# Patient Record
Sex: Female | Born: 1970 | Race: White | Hispanic: No | Smoking: Never smoker
Health system: Southern US, Community
[De-identification: ages and names within clinical notes are randomized; demographics above are authoritative.]

## PROBLEM LIST (undated history)

## (undated) DIAGNOSIS — J302 Other seasonal allergic rhinitis: Secondary | ICD-10-CM

## (undated) DIAGNOSIS — D751 Secondary polycythemia: Secondary | ICD-10-CM

## (undated) DIAGNOSIS — K5792 Diverticulitis of intestine, part unspecified, without perforation or abscess without bleeding: Secondary | ICD-10-CM

## (undated) DIAGNOSIS — F329 Major depressive disorder, single episode, unspecified: Secondary | ICD-10-CM

## (undated) DIAGNOSIS — N882 Stricture and stenosis of cervix uteri: Secondary | ICD-10-CM

## (undated) DIAGNOSIS — R87619 Unspecified abnormal cytological findings in specimens from cervix uteri: Secondary | ICD-10-CM

## (undated) DIAGNOSIS — F419 Anxiety disorder, unspecified: Secondary | ICD-10-CM

## (undated) DIAGNOSIS — D649 Anemia, unspecified: Secondary | ICD-10-CM

## (undated) DIAGNOSIS — N979 Female infertility, unspecified: Secondary | ICD-10-CM

## (undated) DIAGNOSIS — N809 Endometriosis, unspecified: Secondary | ICD-10-CM

## (undated) DIAGNOSIS — F32A Depression, unspecified: Secondary | ICD-10-CM

## (undated) DIAGNOSIS — N912 Amenorrhea, unspecified: Secondary | ICD-10-CM

## (undated) HISTORY — DX: Female infertility, unspecified: N97.9

## (undated) HISTORY — DX: Stricture and stenosis of cervix uteri: N88.2

## (undated) HISTORY — DX: Endometriosis, unspecified: N80.9

## (undated) HISTORY — PX: COLONOSCOPY: SHX174

## (undated) HISTORY — DX: Unspecified abnormal cytological findings in specimens from cervix uteri: R87.619

## (undated) HISTORY — DX: Secondary polycythemia: D75.1

## (undated) HISTORY — DX: Diverticulitis of intestine, part unspecified, without perforation or abscess without bleeding: K57.92

## (undated) HISTORY — DX: Amenorrhea, unspecified: N91.2

## (undated) HISTORY — DX: Anxiety disorder, unspecified: F41.9

---

## 2001-01-11 ENCOUNTER — Other Ambulatory Visit: Admission: RE | Admit: 2001-01-11 | Discharge: 2001-01-11 | Payer: Self-pay | Admitting: Gastroenterology

## 2001-01-11 ENCOUNTER — Encounter (INDEPENDENT_AMBULATORY_CARE_PROVIDER_SITE_OTHER): Payer: Self-pay | Admitting: Specialist

## 2001-09-20 HISTORY — PX: ENDOMETRIAL ABLATION: SHX621

## 2001-09-20 HISTORY — PX: HYSTEROSCOPY: SHX211

## 2001-09-25 ENCOUNTER — Other Ambulatory Visit: Admission: RE | Admit: 2001-09-25 | Discharge: 2001-09-25 | Payer: Self-pay | Admitting: Gynecology

## 2002-09-20 HISTORY — PX: PELVIC LAPAROSCOPY: SHX162

## 2002-11-19 ENCOUNTER — Other Ambulatory Visit: Admission: RE | Admit: 2002-11-19 | Discharge: 2002-11-19 | Payer: Self-pay | Admitting: Gynecology

## 2003-06-21 HISTORY — PX: OOPHORECTOMY: SHX86

## 2003-07-11 ENCOUNTER — Encounter (INDEPENDENT_AMBULATORY_CARE_PROVIDER_SITE_OTHER): Payer: Self-pay | Admitting: *Deleted

## 2003-07-11 ENCOUNTER — Inpatient Hospital Stay (HOSPITAL_COMMUNITY): Admission: RE | Admit: 2003-07-11 | Discharge: 2003-07-12 | Payer: Self-pay | Admitting: Gynecology

## 2003-12-17 ENCOUNTER — Other Ambulatory Visit: Admission: RE | Admit: 2003-12-17 | Discharge: 2003-12-17 | Payer: Self-pay | Admitting: Gynecology

## 2005-01-13 ENCOUNTER — Other Ambulatory Visit: Admission: RE | Admit: 2005-01-13 | Discharge: 2005-01-13 | Payer: Self-pay | Admitting: Gynecology

## 2006-03-10 ENCOUNTER — Other Ambulatory Visit: Admission: RE | Admit: 2006-03-10 | Discharge: 2006-03-10 | Payer: Self-pay | Admitting: Gynecology

## 2007-06-26 ENCOUNTER — Other Ambulatory Visit: Admission: RE | Admit: 2007-06-26 | Discharge: 2007-06-26 | Payer: Self-pay | Admitting: Gynecology

## 2010-05-01 ENCOUNTER — Encounter: Admission: RE | Admit: 2010-05-01 | Discharge: 2010-05-01 | Payer: Self-pay | Admitting: Gynecology

## 2011-02-05 NOTE — Op Note (Signed)
NAME:  Theresa Singleton, Theresa Singleton                       ACCOUNT NO.:  [id number]   MEDICAL RECORD NO.:  [id number]                   PATIENT TYPE:  INP   LOCATION:  X001                                 FACILITY:  [facility]   PHYSICIAN:  Howard C. Mezer, M.D.               DATE OF BIRTH:  08-26-1971   DATE OF PROCEDURE:  07/11/2003  DATE OF DISCHARGE:                                 OPERATIVE REPORT   PREOPERATIVE DIAGNOSES:  1. Pelvic pain.  2. Adhesions.  3. Endometriosis.   POSTOPERATIVE DIAGNOSES:  1. Pelvic pain.  2. Adhesions.  3. Endometriosis.   PROCEDURE:  Exploratory laparotomy with lysis of adhesions and right  salpingo-oophorectomy.   SURGEON:  Leatha Gilding. Mezer, M.D.   ASSISTANT:  Leona Singleton, M.D.   ANESTHESIA:  General endotracheal anesthesia.   PREPARATION:  Betadine.   DESCRIPTION OF PROCEDURE:  With the patient in the supine position she was  prepped and draped in the routine fashion.  A Pfannenstiel incision was made  through the skin and subcutaneous tissue.  The fascia and peritoneum were  opened without difficulty.  Exploration of the upper abdomen was benign.  Exploration of the pelvis revealed the uterus to be normal in size.  The  left ovary was lightly adherent to the cul-de-sac and posterior broad  ligament.  The left fallopian tube was essentially normal.  The right  fallopian tube was essentially normal and the right ovary was densely  adherent to the posterior aspect of the broad ligament and lateral to mid  portion of the uterus.  Attempts to finger dissect the ovary free were not  successful given the denseness of the adhesions.  The ureter was identified  on the medial side of the peritoneum.  The lateral peritoneum was opened and  the excision extended inferiorly and superiorly and parallel to the  infundibulopelvic ligament.  The pelvic sidewall was opened.  The ureter  identified a second time and its course traced. The infundibulopelvic  ligament  was skeletonized, clamped, cut and free tied with #1 chromic and  then suture ligated with #1 chromic.  The base of the ovary was then very  carefully and slowly dissected free, taking care to avoid the ureter and  taking care to not have the dissection be deep enough to enter the uterine  artery and vein, at the same time to remove all of the ovarian tissue.  In  the process of doing this, an ovarian cysts was ruptured that was likely an  endometrioma.  The uterine artery and vein were clamped, cut, and suture  ligated with #1 chromic and then the tube and ovary were crossclamped in the  proximal isthmic portion of the fallopian tube and the stump was suture  ligated with #1 chromic.  The pelvic sidewall was completely dry but at this  point, it was noted that the bowel was significantly advanced in the cul-de-  sac and the posterior aspect of the cervix and uterus.  This area had been  somewhat opened during the dissection process and there was a moderate  amount of oozing and bleeding.  A figure-of-eight suture of 2-0 Vicryl was  placed on the lateral aspect of the uterus, taking care to note the course  of the ureter.  There was some bleeding in the area of the bowel which was  arrested with gentle cautery.  The area was very carefully observed and  hemostasis was noted to be intact.  Given the vascularity of the adhesions  and the fact that the patient's pain was significantly on the right side,  the decision was not to approach the left side, given the significant  potential, that possibly a second salpingo-oophorectomy would need to be  accomplished.  At the completion of the procedure with hemostasis intact, an  effort was made to place the large-bowel in the cul-de-sac.  The omentum was  brought down and the abdomen was closed in layers with running 2-0 Vicryl on  the peritoneum, running 0 Vicryl at the midline bilaterally on the fascia.  Hemostasis was assured in the subcutaneous  tissue and the skin was closed  with staples.  The estimated blood loss was approximately 100 mL.  The  sponge, needle and instrument counts were correct x2.  The patient tolerated  the procedure well and was sent to the recovery room in satisfactory  condition.                                               Leatha Gilding. Mezer, M.D.    HCM/MEDQ  D:  07/11/2003  T:  07/11/2003  Job:  [id number]   cc:   Leona Singleton, M.D.  [address]  [place]  [place] [address]  Fax: [phone]

## 2011-02-05 NOTE — H&P (Signed)
NAME:  AJAI, TERHAAR                       ACCOUNT NO.:  [id number]   MEDICAL RECORD NO.:  [id number]                   PATIENT TYPE:  INP   LOCATION:  X001                                 FACILITY[facility]   PHYSICIAN:  Howard C. Mezer, M.D.               DATE OF BIRTH:  [birth year]   DATE OF ADMISSION:  07/11/2003  DATE OF DISCHARGE:                                HISTORY & PHYSICAL   ADMITTING DIAGNOSIS:  Pelvic pain, adhesions, and endometriosis.   HISTORY OF PRESENT ILLNESS:  The patient is a 40 year old gravida 1 para 1  female admitted with a long history of right lower quadrant pain, adhesions,  and endometriosis.  The patient underwent two previous laparoscopies before  moving to [facility] in 1997 and 1999 at which time endometriosis and right  pelvic adhesions involving the ovary were noted and treated.  The patient  underwent a diagnostic laparoscopy under my care in May 2004 at which time  the right ovary was very densely adherent to the posterior broad ligament  and the attachment of these adhesions was very dense and wide and likely  over the uterine artery and vein.  This did not appear to be approachable  laparoscopically and the decision was made to offer the patient laparotomy.  The patient feels that her pain is significant enough to justify a  laparotomy and right salpingo-oophorectomy and she understands that there is  absolutely no guarantee to relieve her pelvic pain with this surgery.  Exploratory laparotomy and right salpingo-oophorectomy have been discussed  in detail and potential complications including but not limited to injury to  the bowel, bladder, ureter, possible transfusion with its sequelae, possible  infection, and possible need for a hysterectomy.  Increased risks secondary  to obesity have been discussed.  Postoperative restrictions and expectations  have been discussed and pain control has been reviewed.   PAST MEDICAL HISTORY:  1.  Surgical:  Diagnostic laparoscopy x3, hysteroscopy D&C.  2. Medical:  Obesity.   MEDICATIONS:  None.  No herbs or supplements.   ALLERGIES:  None known.   HABITS:  Smokes:  None.  ETOH:  Occasional.   SOCIAL HISTORY:  The patient is married.   FAMILY HISTORY:  Positive for diabetes, hypertension, CVA.   PHYSICAL EXAMINATION:  HEENT:  Negative.  LUNGS:  Clear.  HEART:  Without murmurs.  BREASTS:  Without masses or discharge.  ABDOMEN:  Obese, soft, and nontender.  PELVIC:  Reveals the BUS, vagina, and cervix to be normal.  The uterus is  anteverted, normal in size.  The adnexa are without palpable masses.  EXTREMITIES:  Negative.    IMPRESSION:  Pelvic pain, pelvic adhesions, endometriosis, obesity.   PLAN:  Exploratory laparotomy with lysis of adhesions and right salpingo-  oophorectomy.  Leatha Gilding. Mezer, M.D.    HCM/MEDQ  D:  07/11/2003  T:  07/11/2003  Job:  [id number]

## 2011-05-27 ENCOUNTER — Other Ambulatory Visit: Payer: Self-pay | Admitting: Gynecology

## 2013-10-24 ENCOUNTER — Other Ambulatory Visit: Payer: Self-pay | Admitting: Gynecology

## 2014-07-15 ENCOUNTER — Other Ambulatory Visit: Payer: Self-pay | Admitting: Gynecology

## 2014-07-16 LAB — CYTOLOGY - PAP

## 2014-09-20 DIAGNOSIS — R87619 Unspecified abnormal cytological findings in specimens from cervix uteri: Secondary | ICD-10-CM

## 2014-09-20 HISTORY — DX: Unspecified abnormal cytological findings in specimens from cervix uteri: R87.619

## 2015-01-20 ENCOUNTER — Other Ambulatory Visit: Payer: Self-pay | Admitting: Gynecology

## 2015-01-21 LAB — CYTOLOGY - PAP

## 2015-02-24 ENCOUNTER — Other Ambulatory Visit: Payer: Self-pay | Admitting: Gynecology

## 2015-02-25 LAB — CYTOLOGY - PAP

## 2015-07-21 ENCOUNTER — Ambulatory Visit (INDEPENDENT_AMBULATORY_CARE_PROVIDER_SITE_OTHER): Payer: 59 | Admitting: Obstetrics and Gynecology

## 2015-07-21 ENCOUNTER — Encounter: Payer: Self-pay | Admitting: Obstetrics and Gynecology

## 2015-07-21 VITALS — BP 112/80 | HR 68 | Resp 18 | Ht 64.5 in | Wt 216.0 lb

## 2015-07-21 DIAGNOSIS — N915 Oligomenorrhea, unspecified: Secondary | ICD-10-CM

## 2015-07-21 DIAGNOSIS — Z01419 Encounter for gynecological examination (general) (routine) without abnormal findings: Secondary | ICD-10-CM | POA: Diagnosis not present

## 2015-07-21 DIAGNOSIS — Z0000 Encounter for general adult medical examination without abnormal findings: Secondary | ICD-10-CM

## 2015-07-21 DIAGNOSIS — N926 Irregular menstruation, unspecified: Secondary | ICD-10-CM

## 2015-07-21 LAB — POCT URINALYSIS DIPSTICK
BILIRUBIN UA: NEGATIVE
Blood, UA: 5
GLUCOSE UA: NEGATIVE
KETONES UA: NEGATIVE
Leukocytes, UA: NEGATIVE
Nitrite, UA: NEGATIVE
Protein, UA: NEGATIVE
Urobilinogen, UA: NEGATIVE

## 2015-07-21 LAB — COMPREHENSIVE METABOLIC PANEL
ALBUMIN: 4 g/dL (ref 3.6–5.1)
ALT: 24 U/L (ref 6–29)
AST: 30 U/L (ref 10–35)
Alkaline Phosphatase: 70 U/L (ref 33–115)
BUN: 13 mg/dL (ref 7–25)
CALCIUM: 8.9 mg/dL (ref 8.6–10.2)
CHLORIDE: 108 mmol/L (ref 98–110)
CO2: 27 mmol/L (ref 20–31)
Creat: 0.81 mg/dL (ref 0.50–1.10)
Glucose, Bld: 88 mg/dL (ref 65–99)
POTASSIUM: 4.8 mmol/L (ref 3.5–5.3)
Sodium: 141 mmol/L (ref 135–146)
TOTAL PROTEIN: 7.1 g/dL (ref 6.1–8.1)
Total Bilirubin: 0.6 mg/dL (ref 0.2–1.2)

## 2015-07-21 LAB — CBC
HEMATOCRIT: 45.7 % (ref 36.0–46.0)
HEMOGLOBIN: 15.5 g/dL — AB (ref 12.0–15.0)
MCH: 32.7 pg (ref 26.0–34.0)
MCHC: 33.9 g/dL (ref 30.0–36.0)
MCV: 96.4 fL (ref 78.0–100.0)
MPV: 8.4 fL — AB (ref 8.6–12.4)
Platelets: 282 10*3/uL (ref 150–400)
RBC: 4.74 MIL/uL (ref 3.87–5.11)
RDW: 13.2 % (ref 11.5–15.5)
WBC: 6.8 10*3/uL (ref 4.0–10.5)

## 2015-07-21 LAB — HEMOGLOBIN, FINGERSTICK: Hemoglobin, fingerstick: 15.6 g/dL (ref 12.0–16.0)

## 2015-07-21 LAB — LIPID PANEL
CHOL/HDL RATIO: 3.4 ratio (ref <=5.0)
Cholesterol: 165 mg/dL (ref 125–200)
HDL: 49 mg/dL (ref >=46)
LDL Cholesterol: 103 mg/dL (ref <130)
TRIGLYCERIDES: 67 mg/dL (ref <150)
VLDL: 13 mg/dL (ref <30)

## 2015-07-21 LAB — POCT URINE PREGNANCY: Preg Test, Ur: NEGATIVE

## 2015-07-21 LAB — THYROID PANEL WITH TSH
FREE THYROXINE INDEX: 2.2 (ref 1.4–3.8)
T3 UPTAKE: 28 % (ref 22–35)
T4, Total: 7.8 ug/dL (ref 4.5–12.0)
TSH: 2.949 u[IU]/mL (ref 0.350–4.500)

## 2015-07-21 NOTE — Progress Notes (Addendum)
44 y.o. G66P1001 Divorced Caucasian female here for annual exam.    Hx of endometriosis dx in 1997 through laparoscopy.  Had an ovarian cyst removal? Treated with Lupron.  Repeat laparoscopy in 1998 or 1999.   Status post laparotomy with right salpingo-oophorectomy in 2004 for endometrioma/endometriosis.   Menses now are skipping.  Can skip 6 - 8 weeks.  LMP was in September 16 - 22, 2016.  In October, bleeding has been intermittent spotting with clotting.   Spotted 10/1 off and on and then had clotting in mid-October. Spotting a little last week.  Increasing pain this month off and on. Usually has a lot of pain during her menses.  Has used Ponstel for pain in the past.  Naproxen Rx has not worked as well.   Took Camilla in the past and just stopped due to forgetting it.  Used NuvaRing in the past also.  Partner has had vasectomy.   Denies painful bowel movements.  Denies blood in the stool.  Denies dysuria or hematuria.   Has an Rx from Dr. Carren Rang for Provera but has not taken this.   Last pelvic ultrasound was a couple of year ago.   Hx of monitoring CA125s by Dr. Carren Rang to do monitor for ovarian cancer.  Has been normal in the past.  No family history of ovarian cancer.   UPT - neg. Hgb 15.6.  Has a 54 yo son and is planning to go to law school.  Works at the [employer] in [place].  Specialist in trauma and anxiety.   PCP:   Rolanda Lundborg  Patient's last menstrual period was 06/06/2015.          Sexually active: Yes.    The current method of family planning is vasectomy.    Exercising: Yes.    Walking Smoker:  no  Health Maintenance: Pap:  02/24/15 neg History of abnormal Pap:  Yes, CIN1 2016.   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 Pathology showed koilocytic atypia and endometriosis and ECC showed acute and chronic inflammation. Pap 10/17/13 - LGSIL and negative HR HPV, Pap 03/19/14 - Neg and meg HR HPV, Pap 07/15/14 - negative and no  high risk HPV testing, Pap 01/20/15 - negative but no endocervical cells and no high risk HPV testing, Pap 02/24/15 - negative pap and no HR HPV testing done.  Specimen was adequate.  MMG:  06/2014 Normal - per pt.  Colonoscopy:  2014 Diverticulitis  BMD:   Never  TDaP:  2014 Screening Labs:  Hb today: 15.6, Urine today: Negative    reports that she has never smoked. She has never used smokeless tobacco. She reports that she drinks about 1.2 oz of alcohol per week. She reports that she does not use illicit drugs.  Past Medical History  Diagnosis Date  . Amenorrhea   . Endometriosis   . Anxiety   . Infertility, female   . Diverticulitis   . Abnormal Pap smear of cervix 2016    CIN I    Past Surgical History  Procedure Laterality Date  . Oophorectomy Right 06/2003     Endometriosis   . Pelvic laparoscopy  2004    several. endometriosis   . Hysteroscopy  2003  . Endometrial ablation  2003    Current Outpatient Prescriptions  Medication Sig Dispense Refill  . FIBER COMPLETE PO Take by mouth daily.    [name] glucosamine-chondroitin 500-400 MG tablet Take 1 tablet by mouth daily.    [name]  Multiple Vitamin (MULTIVITAMIN) tablet Take 1 tablet by mouth daily.    . naproxen sodium (ANAPROX) 550 MG tablet Take 550 mg by mouth 3 (three) times daily with meals.     . medroxyPROGESTERone (PROVERA) 10 MG tablet Take 10 mg by mouth daily.      No current facility-administered medications for this visit.    Family History  Problem Relation Age of Onset  . Diabetes Maternal Uncle   . Diabetes Maternal Uncle     ROS:  Pertinent items are noted in HPI.  Otherwise, a comprehensive ROS was negative.  Exam:   BP 112/80 mmHg  Pulse 68  Resp 18  Ht 5' 4.5" (1.638 m)  Wt 216 lb (97.977 kg)  BMI 36.52 kg/m2  LMP 06/06/2015    General appearance: alert, cooperative and appears stated age Head: Normocephalic, without obvious abnormality, atraumatic Neck: no adenopathy, supple, symmetrical, trachea  midline and thyroid normal to inspection and palpation Lungs: clear to auscultation bilaterally Breasts: normal appearance, no masses or tenderness, Inspection negative, No nipple retraction or dimpling, No nipple discharge or bleeding, No axillary or supraclavicular adenopathy Heart: regular rate and rhythm Abdomen: soft, non-tender; bowel sounds normal; no masses,  no organomegaly Extremities: extremities normal, atraumatic, no cyanosis or edema on the right.  Left lower extremity with extensive bruising and edema (States she fell and had evaluation with her PCP who cleared her leg.) Skin: Skin color, texture, turgor normal. No rashes or lesions Lymph nodes: Cervical, supraclavicular, and axillary nodes normal. No abnormal inguinal nodes palpated Neurologic: Grossly normal  Pelvic: External genitalia:  no lesions              Urethra:  normal appearing urethra with no masses, tenderness or lesions              Bartholins and Skenes: normal                 Vagina: normal appearing vagina with normal color and discharge, no lesions              Cervix: cervix distorted and firm to touch. hard to really identify the cervical os.               Pap taken: Yes.   Bimanual Exam:  Uterus:  normal size, contour, position, consistency, mobility, non-tender.  Exam limited by body habitus.               Adnexa: normal adnexa              Rectovaginal: Yes.  .  Confirms.              Anus:  normal sphincter tone, no lesions  Chaperone was present for exam.  Assessment:   Well woman visit with normal exam. Irregular menses.  Dysmenorrhea. Status post laparoscopies and right salpingo-oophorectomy - endometriosis.  Hx LGSIL.  Hx of biopsy confirmed endometriosis of the cervix. Cervical distortion on exam.   Plan: Yearly mammogram recommended after age 75.  Recommended self breast exam.  Pap and HR HPV as above. Recommendations for Calcium, Vitamin D, regular exercise program including  cardiovascular and weight bearing exercise. Labs performed.  Yes.  .   See orders.  Lipids, CMP, TFTs, CBC.  Refills given on medications.  No..    Return for sonohysterogram and potential endometrial biopsy.       After visit summary provided.

## 2015-07-21 NOTE — Patient Instructions (Signed)

## 2015-07-23 ENCOUNTER — Telehealth: Payer: Self-pay

## 2015-07-23 LAB — IPS PAP TEST WITH HPV

## 2015-07-23 NOTE — Telephone Encounter (Signed)
-----   Message from Nunzio Cobbs, MD sent at 07/22/2015  7:53 PM EDT ----- Please report normal blood work to patient.  Lipids, complete blood chemistries, blood counts, and thyroid function are all unremarkable.  Cc- Marisa Sprinkles

## 2015-07-23 NOTE — Telephone Encounter (Signed)
Left message to call Kaitlyn at [phone]. 

## 2015-07-23 NOTE — Telephone Encounter (Signed)
Spoke with patient. Advised of results as seen below. Patient is agreeable and verbalizes understanding.  Patient would like to schedule [facility] at this time. Appointment scheduled for 08/07/2015 at 8:30 am with 9 am consult with Dr.Silva. Patient is agreeable to date and time. Patient verbalized understanding of the [facility] appointment cancellation policy. Advised will need to cancel or reschedule within 72 business hours of appointment (3 business days) or will have $100.00 late cancellation fee placed to account. $150.00 for Sonohysterogram.   Routing to provider for final review. Patient agreeable to disposition. Will close encounter.

## 2015-08-07 ENCOUNTER — Ambulatory Visit (INDEPENDENT_AMBULATORY_CARE_PROVIDER_SITE_OTHER): Payer: 59 | Admitting: Obstetrics and Gynecology

## 2015-08-07 ENCOUNTER — Ambulatory Visit (INDEPENDENT_AMBULATORY_CARE_PROVIDER_SITE_OTHER): Payer: 59

## 2015-08-07 ENCOUNTER — Encounter: Payer: Self-pay | Admitting: Obstetrics and Gynecology

## 2015-08-07 ENCOUNTER — Other Ambulatory Visit: Payer: Self-pay | Admitting: Obstetrics and Gynecology

## 2015-08-07 VITALS — BP 126/86 | HR 70 | Resp 20 | Ht 64.5 in | Wt 215.0 lb

## 2015-08-07 DIAGNOSIS — N882 Stricture and stenosis of cervix uteri: Secondary | ICD-10-CM | POA: Diagnosis not present

## 2015-08-07 DIAGNOSIS — N926 Irregular menstruation, unspecified: Secondary | ICD-10-CM

## 2015-08-07 DIAGNOSIS — N83202 Unspecified ovarian cyst, left side: Secondary | ICD-10-CM

## 2015-08-07 MED ORDER — IBUPROFEN 800 MG PO TABS: 800.0000 mg | ORAL_TABLET | Freq: Three times a day (TID) | ORAL | Status: DC | PRN

## 2015-08-07 MED ORDER — LEVONORGEST-ETH ESTRAD 91-DAY 0.15-0.03 MG PO TABS: 1.0000 | ORAL_TABLET | Freq: Every day | ORAL | Status: DC

## 2015-08-07 NOTE — Progress Notes (Signed)
Subjective  44 y.o. G49P1001  Caucasian female here for pelvic ultrasound for  Irregular spotting in October and pain with menses.  Last real menses was 06/06/15. Also has pain with menses.  Had episode of significant pain last week.  Wants new Rx for pain medication.  Naprosyn not helpful.  Ponstel too expensive.   Status post laparotomy with right salpingo-oophorectomy in 2004 for endometrioma/endometriosis.  Partner has had vasectomy.  Used NuvaRing and Camilla in the past.  No problems with combined contraception.   No HTN, migraines with aura, liver disease.  Is due for mammogram and will call for this to be done at [facility].   Objective  Pelvic ultrasound images and report reviewed with patient.  Uterus - no masses.  EMS - 12 mm. Ovaries - Right adnexa absent.  Left ovary witt 2 hemorrhagic cysts - 51 x 45 mm, 3.63 x 3.14 x 2.67 mm.  No clear endometrioma. Free fluid - yes, small amount around the left adnexa.      Attempted to do sonohysterogram and EMB.  Procedure abandoned. Speculum placed in vagina.  Sterile prep with betadine.  Tenaculum to anterior cervical lip.  Cannula does not pass. Os finder, small dilator used to try to get into os.  I think I am creating a false passage.   Assessment  Metrorrhagia recently.  Skipping cycles.  Dysmenorrhea. Hemorrhagic ovarian cysts. Hx of cervical endometriosis and abnormal pap.  Stenosis of cervix.  Plan  Discussion of options for care - high dose progesterone, combined contraception, surgical care. I do not feel strongly that the patient needs a dilation and curettage at this time.  Patient prefers medical therapy.  Would like a pill - Seasonale Rx given for 3 months.  Rx for Motrin 800 mg. Torsion precautions given.  Will do mammogram.  Return in 6 weeks for a repeat ultrasound and pill check.    ___15____ minutes face to face time of which over 50% was spent in counseling.   After visit summary to  patient.

## 2015-08-07 NOTE — Patient Instructions (Signed)
Ethinyl Estradiol; Levonorgestrel tablets What is this medicine? ETHINYL ESTRADIOL; LEVONORGESTREL (ETH in il es tra DYE ole; LEE voh nor jes trel) is an oral contraceptive. It combines two types of female hormones, an estrogen and a progestin. They are used to prevent ovulation and pregnancy. This medicine may be used for other purposes; ask your health care provider or pharmacist if you have questions. What should I tell my health care provider before I take this medicine? They need to know if you have or ever had any of these conditions: -abnormal vaginal bleeding -blood vessel disease or blood clots -breast, cervical, endometrial, ovarian, liver, or uterine cancer -diabetes -gallbladder disease -heart disease or recent heart attack -high blood pressure -high cholesterol -kidney disease -liver disease -migraine headaches -stroke -systemic lupus erythematosus (SLE) -tobacco smoker -an unusual or allergic reaction to estrogens, progestins, other medicines, foods, dyes, or preservatives -pregnant or trying to get pregnant -breast-feeding How should I use this medicine? Take this medicine by mouth. To reduce nausea, this medicine may be taken with food. Follow the directions on the prescription label. Take this medicine at the same time each day and in the order directed on the package. Do not take your medicine more often than directed. Contact your pediatrician regarding the use of this medicine in children. Special care may be needed. This medicine has been used in female children who have started having menstrual periods. A patient package insert for the product will be given with each prescription and refill. Read this sheet carefully each time. The sheet may change frequently. Overdosage: If you think you have taken too much of this medicine contact a poison control center or emergency room at once. NOTE: This medicine is only for you. Do not share this medicine with others. What if  I miss a dose? If you miss a dose, refer to the patient information sheet you received with your medicine for direction. If you miss more than one pill, this medicine may not be as effective and you may need to use another form of birth control. What may interact with this medicine? -acetaminophen -antibiotics or medicines for infections, especially rifampin, rifabutin, rifapentine, and griseofulvin, and possibly penicillins or tetracyclines -aprepitant -ascorbic acid (vitamin C) -atorvastatin -barbiturate medicines, such as phenobarbital -bosentan -carbamazepine -caffeine -clofibrate -cyclosporine -dantrolene -doxercalciferol -felbamate -grapefruit juice -hydrocortisone -medicines for anxiety or sleeping problems, such as diazepam or temazepam -medicines for diabetes, including pioglitazone -mineral oil -modafinil -mycophenolate -nefazodone -oxcarbazepine -phenytoin -prednisolone -ritonavir or other medicines for HIV infection or AIDS -rosuvastatin -selegiline -soy isoflavones supplements -St. John's wort -tamoxifen or raloxifene -theophylline -thyroid hormones -topiramate -warfarin This list may not describe all possible interactions. Give your health care provider a list of all the medicines, herbs, non-prescription drugs, or dietary supplements you use. Also tell them if you smoke, drink alcohol, or use illegal drugs. Some items may interact with your medicine. What should I watch for while using this medicine? Visit your doctor or health care professional for regular checks on your progress. You will need a regular breast and pelvic exam and Pap smear while on this medicine. Use an additional method of contraception during the first cycle that you take these tablets. If you have any reason to think you are pregnant, stop taking this medicine right away and contact your doctor or health care professional. If you are taking this medicine for hormone related problems, it  may take several cycles of use to see improvement in your condition. Smoking increases the risk of   getting a blood clot or having a stroke while you are taking birth control pills, especially if you are more than 45 years old. You are strongly advised not to smoke. This medicine can make your body retain fluid, making your fingers, hands, or ankles swell. Your blood pressure can go up. Contact your doctor or health care professional if you feel you are retaining fluid. This medicine can make you more sensitive to the sun. Keep out of the sun. If you cannot avoid being in the sun, wear protective clothing and use sunscreen. Do not use sun lamps or tanning beds/booths. If you wear contact lenses and notice visual changes, or if the lenses begin to feel uncomfortable, consult your eye care specialist. In some women, tenderness, swelling, or minor bleeding of the gums may occur. Notify your dentist if this happens. Brushing and flossing your teeth regularly may help limit this. See your dentist regularly and inform your dentist of the medicines you are taking. If you are going to have elective surgery, you may need to stop taking this medicine before the surgery. Consult your health care professional for advice. This medicine does not protect you against HIV infection (AIDS) or any other sexually transmitted diseases. What side effects may I notice from receiving this medicine? Side effects that you should report to your doctor or health care professional as soon as possible: -breast tissue changes or discharge -changes in vaginal bleeding during your period or between your periods -chest pain -coughing up blood -dizziness or fainting spells -headaches or migraines -leg, arm or groin pain -severe or sudden headaches -stomach pain (severe) -sudden shortness of breath -sudden loss of coordination, especially on one side of the body -speech problems -symptoms of vaginal infection like itching,  irritation or unusual discharge -tenderness in the upper abdomen -vomiting -weakness or numbness in the arms or legs, especially on one side of the body -yellowing of the eyes or skin Side effects that usually do not require medical attention (report to your doctor or health care professional if they continue or are bothersome): -breakthrough bleeding and spotting that continues beyond the 3 initial cycles of pills -breast tenderness -mood changes, anxiety, depression, frustration, anger, or emotional outbursts -increased sensitivity to sun or ultraviolet light -nausea -skin rash, acne, or brown spots on the skin -weight gain (slight) This list may not describe all possible side effects. Call your doctor for medical advice about side effects. You may report side effects to FDA at 1-800-FDA-1088. Where should I keep my medicine? Keep out of the reach of children. Store at room temperature between 15 and 30 degrees C (59 and 86 degrees F). Throw away any unused medicine after the expiration date. NOTE: This sheet is a summary. It may not cover all possible information. If you have questions about this medicine, talk to your doctor, pharmacist, or health care provider.    2016, Elsevier/Gold Standard. (2013-12-17 10:20:56)  

## 2015-09-23 ENCOUNTER — Telehealth: Payer: Self-pay | Admitting: Obstetrics and Gynecology

## 2015-09-23 NOTE — Telephone Encounter (Signed)
Call patient to discuss benefits for a procedure. Left Voicemail requesting a call.

## 2015-10-13 NOTE — Telephone Encounter (Signed)
Called patient to discuss benefits for a procedure. Left Voicemail requesting a call. °

## 2015-10-15 NOTE — Telephone Encounter (Signed)
Call patient to discuss benefits for a procedure. Left Voicemail requesting a call.

## 2015-10-20 DIAGNOSIS — D751 Secondary polycythemia: Secondary | ICD-10-CM | POA: Insufficient documentation

## 2015-10-20 NOTE — Telephone Encounter (Signed)
Patient called back was stated she was able to move her schedule around. Reviewed estimated benefit for ultrasound. Patient understood and agreeable. Patient ready to schedule. Patient scheduled 10/30/15 with Dr Quincy Simmonds. Pt aware of arrival date and time. Pt aware of 72 hours cancellation policy with [id number] fee. No further questions. Ok to close

## 2015-10-20 NOTE — Telephone Encounter (Signed)
Patient returned call regarding ultrasound. Patient had advised that she is only available for on Mondays and states had spoken with Dr Quincy Simmonds regarding this concern, and it was suggested that we can schedule at the hospital. Forwarding to clinical to review for scheduling options

## 2015-10-30 ENCOUNTER — Encounter: Payer: Self-pay | Admitting: Obstetrics and Gynecology

## 2015-10-30 ENCOUNTER — Ambulatory Visit (INDEPENDENT_AMBULATORY_CARE_PROVIDER_SITE_OTHER): Payer: 59 | Admitting: Obstetrics and Gynecology

## 2015-10-30 ENCOUNTER — Ambulatory Visit (INDEPENDENT_AMBULATORY_CARE_PROVIDER_SITE_OTHER): Payer: 59

## 2015-10-30 VITALS — BP 112/70 | HR 80 | Ht 64.5 in | Wt 217.0 lb

## 2015-10-30 DIAGNOSIS — N83202 Unspecified ovarian cyst, left side: Secondary | ICD-10-CM

## 2015-10-30 DIAGNOSIS — N946 Dysmenorrhea, unspecified: Secondary | ICD-10-CM

## 2015-10-30 DIAGNOSIS — N921 Excessive and frequent menstruation with irregular cycle: Secondary | ICD-10-CM | POA: Diagnosis not present

## 2015-10-30 DIAGNOSIS — N926 Irregular menstruation, unspecified: Secondary | ICD-10-CM

## 2015-10-30 MED ORDER — LEVONORGEST-ETH ESTRAD 91-DAY 0.15-0.03 MG PO TABS: 1.0000 | ORAL_TABLET | Freq: Every day | ORAL | Status: DC

## 2015-10-30 NOTE — Progress Notes (Signed)
Subjective  45 y.o. G70P1001 Divorced female here for pelvic ultrasound for follow up of left ovarian cysts from prior ultrasound 08/07/15 in this office: Ovaries - Right adnexa absent. Left ovary with 2 hemorrhagic cysts - 51 x 45 mm, 3.63 x 3.14 x 2.67 mm. No clear endometrioma.  Patient's last menstrual period was 10/20/2015 (approximate).  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  Status post 5 laparoscopies. Status post laparotomy with right salpingo-oophorectomy in 2004 for endometrioma/endometriosis. Had laparotomy due to scar tissue. Partner has had vasectomy.  Declines future childbearing.   Used NuvaRing and Camilla in the past.  Currently on Seasonale.  Having pain and heavy clotting.  No real improvement on Seasonale.  Spotted throughout December 2016 and January 2017. No problems with combined contraception.  No HTN, migraines with aura, liver disease.  Used Depo Lupron in the past and had terrible hot flashes and went on estrogen patch.   Last mammogram - 2015.   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   Objective  Pelvic ultrasound images and report reviewed with patient.  Uterus - no masses. EMS - 7.81 mm. Ovaries - left ovary 34 x 28 mm cystic and solid area with increased vascular flow.  Hemorrhagic cyst versus hydrosalpinx.  Second cyst 25 x 29 mm - hemorrhagic cyst versus endometrioma. Free fluid - no     Assessment  Menorrhagia and dysmenorrhea. Hx of endometriosis and multiple prior surgeries. Left ovarian cysts.  Increase blood flow to one cyst. Inability to sample endometrium due to cervical stenosis.  Hx of cervical endometriosis.  Plan  Will do CA125 today.  Follow up U/S in 6 weeks. Discussed robotic TLH with LSO versus hysteroscopy with dilation and curettage and laparoscopic robotic LSO with collection of pelvic washings.  Prefers the  latter.  Risks of laparoscopy include but are not limited to bleeding, infection, damage to surrounding organs, reaction to anesthesia, pneumonia, DVT, PE, death, hernia formation, need to convert to laparotomy, and need for reoperation. I did refill Seasonale for 3 more months.  She promises she will do her mammogram.  She understands the importance of this.  ___25____ minutes face to face time of which over 50% was spent in counseling.   After visit summary to patient.

## 2015-10-31 LAB — CA 125: CA 125: 18 U/mL (ref <35)

## 2015-11-03 ENCOUNTER — Telehealth: Payer: Self-pay

## 2015-11-03 ENCOUNTER — Other Ambulatory Visit: Payer: Self-pay

## 2015-11-03 DIAGNOSIS — Z1231 Encounter for screening mammogram for malignant neoplasm of breast: Secondary | ICD-10-CM

## 2015-11-03 NOTE — Telephone Encounter (Signed)
-----   Message from Nunzio Cobbs, MD sent at 10/31/2015  2:13 PM EST ----- Please inform patient that the CA 125 is negative.  We will be in touch about proceeding forward with surgical planning.  Cc- Marisa Sprinkles

## 2015-11-03 NOTE — Telephone Encounter (Signed)
Spoke with patient. Advised of message as seen below from [place]. She is agreeable and verbalizes understanding.  Routing to provider for final review. Patient agreeable to disposition. Will close encounter.

## 2015-11-11 ENCOUNTER — Telehealth: Payer: Self-pay | Admitting: *Deleted

## 2015-11-11 NOTE — Telephone Encounter (Signed)
Return call to patient. Date options discussed and patient and she desires to proceed with 12-09-15. Surgery scheduled for 12-09-15 at 0730 at [facility], arrive at 0600. Surgery instruction sheet reviewed and printed copy to be mailed, see copy scanned to chart.  Routing to provider for final review. Patient agreeable to disposition. Will close encounter.

## 2015-11-11 NOTE — Telephone Encounter (Signed)
Call to patient to discuss surgery date options. Per ROI, leave detailed message on cell phone which confirms patients name. Left message that I am returning her call to discuss dates and she may call back with available dates if I am unavailable.

## 2015-11-19 ENCOUNTER — Telehealth: Payer: Self-pay | Admitting: Obstetrics and Gynecology

## 2015-11-24 ENCOUNTER — Encounter: Payer: Self-pay | Admitting: Obstetrics and Gynecology

## 2015-11-24 ENCOUNTER — Ambulatory Visit
Admission: RE | Admit: 2015-11-24 | Discharge: 2015-11-24 | Disposition: A | Discharge status: Home / Self Care | Payer: 59 | Source: Ambulatory Visit

## 2015-11-24 ENCOUNTER — Ambulatory Visit (INDEPENDENT_AMBULATORY_CARE_PROVIDER_SITE_OTHER): Payer: 59 | Admitting: Obstetrics and Gynecology

## 2015-11-24 VITALS — Ht 64.5 in

## 2015-11-24 DIAGNOSIS — N920 Excessive and frequent menstruation with regular cycle: Secondary | ICD-10-CM | POA: Diagnosis not present

## 2015-11-24 DIAGNOSIS — N83202 Unspecified ovarian cyst, left side: Secondary | ICD-10-CM

## 2015-11-24 DIAGNOSIS — N946 Dysmenorrhea, unspecified: Secondary | ICD-10-CM

## 2015-11-24 DIAGNOSIS — Z1231 Encounter for screening mammogram for malignant neoplasm of breast: Secondary | ICD-10-CM

## 2015-11-24 NOTE — Progress Notes (Signed)
Patient ID: Theresa Singleton, female   DOB: 23-Apr-1971, 45 y.o.   MRN: [id number] GYNECOLOGY  VISIT   HPI: 45 y.o.   Divorced  Caucasian  female   G1P1001 with Patient's last menstrual period was 11/10/2015 (exact date).   here for surgical consult for left ovarian mass noted on ultrasound.  Pelvic ultrasound 10/30/15: Uterus - no masses. EMS - 7.81 mm. Ovaries - left ovary 34 x 28 mm cystic and solid area with increased vascular flow. Hemorrhagic cyst versus hydrosalpinx. Second cyst 25 x 29 mm - hemorrhagic cyst versus endometrioma. Free fluid - no  CA125 on 10/30/15 - 18.  Prior pelvic ultrasound on 08/07/15 in this office: Ovaries - Right adnexa absent. Left ovary with 2 hemorrhagic cysts - 51 x 45 mm, 3.63 x 3.14 x 2.67 mm. No clear endometrioma.  Unable to do endometrial biopsy on 08/07/15 for menorrhagia due to cervical stenosis.  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  Status post 5 laparoscopies. Status post laparotomy with right salpingo-oophorectomy in 2004 for endometrioma/endometriosis. Had laparotomy due to scar tissue. Partner has had vasectomy.  Declines future childbearing.   Used NuvaRing and Camilla in the past.  Currently on Seasonale. Having pain and heavy clotting. No real improvement on Seasonale. Spotted throughout December 2016 and January 2017. No problems with combined contraception.  No HTN, migraines with aura, liver disease.  Used Depo Lupron in the past and had terrible hot flashes and went on estrogen patch.   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   GYNECOLOGIC HISTORY: Patient's last menstrual period was 11/10/2015 (exact date). Contraception: Vasectomy Menopausal hormone therapy:n/a Last mammogram: 05-01-10 Diag.Left and Left U/S--Neg/BiRads 2/return to screening:[facility].  Patient has appt. today for mammogram at  3:00pm Last pap smear: 07-21-15 Neg:Neg HR HPV        OB History    Gravida Para Term Preterm AB TAB SAB Ectopic Multiple Living   1 1 1       1          There are no active problems to display for this patient.   Past Medical History  Diagnosis Date  . Amenorrhea   . Endometriosis   . Anxiety   . Infertility, female   . Diverticulitis   . Abnormal Pap smear of cervix 2016    CIN I  . Cervical stenosis (uterine cervix)     Past Surgical History  Procedure Laterality Date  . Oophorectomy Right 06/2003     Endometriosis   . Pelvic laparoscopy  2004    several. endometriosis   . Hysteroscopy  2003  . Endometrial ablation  2003    Current Outpatient Prescriptions  Medication Sig Dispense Refill  . glucosamine-chondroitin 500-400 MG tablet Take 1 tablet by mouth daily.    [name] ibuprofen (ADVIL,MOTRIN) 800 MG tablet Take 800 mg by mouth as needed for mild pain.     [name] levonorgestrel-ethinyl estradiol (SEASONALE,INTROVALE,JOLESSA) 0.15-0.03 MG tablet Take 1 tablet by mouth daily. 3 Package 0  . LORazepam (ATIVAN) 0.5 MG tablet Take 0.5 mg by mouth at bedtime as needed for sleep.     . Multiple Vitamin (MULTIVITAMIN) tablet Take 1 tablet by mouth daily.    . NON FORMULARY Take 1 tablet by mouth daily. Calm-aid     No current facility-administered medications for this visit.     ALLERGIES: Aspirin and Codeine  Family History  Problem Relation Age of  Onset  . Diabetes Maternal Uncle   . Diabetes Maternal Uncle     Social History   Social History  . Marital Status: Divorced    Spouse Name: N/A  . Number of Children: N/A  . Years of Education: N/A   Occupational History  . Not on file.   Social History Main Topics  . Smoking status: Never Smoker   . Smokeless tobacco: Never Used  . Alcohol Use: 1.2 oz/week    2 Glasses of wine per week  . Drug Use: No  . Sexual Activity:    Partners: Male    Birth Control/ Protection: Surgical     Comment: vasectomy -  boyfriend    Other Topics Concern  . Not on file   Social History Narrative    ROS:  Pertinent items are noted in HPI.  PHYSICAL EXAMINATION:    Ht 5' 4.5" (1.638 m)  LMP 11/10/2015 (Exact Date)    General appearance: alert, cooperative and appears stated age Head: Normocephalic, without obvious abnormality, atraumatic Neck: no adenopathy, supple, symmetrical, trachea midline and thyroid normal to inspection and palpation Lungs: clear to auscultation bilaterally Heart: regular rate and rhythm Abdomen: Pfannenstiel incision, soft, non-tender; bowel sounds normal; no masses,  no organomegaly Extremities: extremities normal, atraumatic, no cyanosis or edema Skin: Skin color, texture, turgor normal. No rashes or lesions Lymph nodes: Cervical, supraclavicular, and axillary nodes normal. No abnormal inguinal nodes palpated Neurologic: Grossly normal  Pelvic: External genitalia:  no lesions              Urethra:  normal appearing urethra with no masses, tenderness or lesions              Bartholins and Skenes: normal                 Vagina: normal appearing vagina with normal color and discharge, no lesions              Cervix: Distorted due to scarring.  Palpates normal.        Bimanual Exam:  Uterus:  normal size, contour, position, consistency, mobility, non-tender              Adnexa: no mass, fullness, tenderness          Chaperone was present for exam.  ASSESSMENT  Menorrhagia and dysmenorrhea. Hx of endometriosis and multiple prior surgeries. Left ovarian cysts. Increase blood flow to one cyst. Inability to sample endometrium due to cervical stenosis. Hx of cervical endometriosis.   PLAN  Plan for hysteroscopy with dilation and curettage and laparoscopic robotic LSO with collection of pelvic washings.   Risks of laparoscopy include but are not limited to bleeding, infection, damage to surrounding organs, reaction to anesthesia, pneumonia, DVT, PE, death, hernia  formation, need to convert to laparotomy, and need for reoperation. Patient is OK with laparotomy with removal of left tube and ovary if needed, and she declines hysterectomy.   She understand that removal of the remaining ovary will result in menopausal symptoms which can be treated with hormonal therapy including estrogen, progesterone, and testosterone.  Surgical incisions reviewed. Has an appointment for mammogram today.  An After Visit Summary was printed and given to the patient.  _25_____ minutes face to face time of which over 50% was spent in counseling.

## 2015-12-01 ENCOUNTER — Encounter (HOSPITAL_COMMUNITY)
Admission: RE | Admit: 2015-12-01 | Discharge: 2015-12-01 | Disposition: A | Discharge status: Home / Self Care | Payer: 59 | Source: Ambulatory Visit | Attending: Obstetrics and Gynecology | Admitting: Obstetrics and Gynecology

## 2015-12-01 ENCOUNTER — Encounter (HOSPITAL_COMMUNITY): Payer: Self-pay

## 2015-12-01 DIAGNOSIS — Z01812 Encounter for preprocedural laboratory examination: Secondary | ICD-10-CM | POA: Diagnosis present

## 2015-12-01 LAB — CBC
HCT: 44 % (ref 36.0–46.0)
Hemoglobin: 15.7 g/dL — ABNORMAL HIGH (ref 12.0–15.0)
MCH: 33.3 pg (ref 26.0–34.0)
MCHC: 35.7 g/dL (ref 30.0–36.0)
MCV: 93.4 fL (ref 78.0–100.0)
PLATELETS: 280 10*3/uL (ref 150–400)
RBC: 4.71 MIL/uL (ref 3.87–5.11)
RDW: 13.1 % (ref 11.5–15.5)
WBC: 6.1 10*3/uL (ref 4.0–10.5)

## 2015-12-01 LAB — BASIC METABOLIC PANEL
Anion gap: 5 (ref 5–15)
BUN: 12 mg/dL (ref 6–20)
CALCIUM: 8.7 mg/dL — AB (ref 8.9–10.3)
CHLORIDE: 108 mmol/L (ref 101–111)
CO2: 26 mmol/L (ref 22–32)
CREATININE: 0.78 mg/dL (ref 0.44–1.00)
GFR calc non Af Amer: >60 mL/min (ref >60)
GLUCOSE: 92 mg/dL (ref 65–99)
Potassium: 4 mmol/L (ref 3.5–5.1)
Sodium: 139 mmol/L (ref 135–145)

## 2015-12-01 NOTE — Patient Instructions (Signed)
Your procedure is scheduled on:12/09/15  Enter through the Main Entrance at : [facility] up desk phone and dial [phone] and inform us of your arrival.  Please call [phone] if you have any problems the morning of surgery.  Remember: Do not eat food or drink liquids, including water, after midnight:Monday    You may brush your teeth the morning of surgery.  Take these meds the morning of surgery with a sip of water:none  DO NOT wear jewelry, eye make-up, lipstick,body lotion, or dark fingernail polish.  (Polished toes are ok) You may wear deodorant.  If you are to be admitted after surgery, leave suitcase in car until your room has been assigned. Patients discharged on the day of surgery will not be allowed to drive home. Wear loose fitting, comfortable clothes for your ride home.

## 2015-12-08 NOTE — Anesthesia Preprocedure Evaluation (Addendum)
Anesthesia Evaluation  Patient identified by MRN, date of birth, ID band Patient awake    Reviewed: Allergy & Precautions, NPO status , Patient's Chart, lab work & pertinent test results  Airway Mallampati: I  TM Distance: >3 FB Neck ROM: Full    Dental  (+) Dental Advisory Given   Pulmonary neg pulmonary ROS,    breath sounds clear to auscultation       Cardiovascular negative cardio ROS   Rhythm:Regular Rate:Normal     Neuro/Psych negative neurological ROS     GI/Hepatic negative GI ROS, Neg liver ROS,   Endo/Other  negative endocrine ROSMorbid obesity  Renal/GU negative Renal ROS     Musculoskeletal   Abdominal   Peds  Hematology negative hematology ROS (+)   Anesthesia Other Findings   Reproductive/Obstetrics                            Lab Results  Component Value Date   WBC 6.1 12/01/2015   HGB 15.7* 12/01/2015   HCT 44.0 12/01/2015   MCV 93.4 12/01/2015   PLT 280 12/01/2015   Lab Results  Component Value Date   CREATININE 0.78 12/01/2015   BUN 12 12/01/2015   NA 139 12/01/2015   K 4.0 12/01/2015   CL 108 12/01/2015   CO2 26 12/01/2015    Anesthesia Physical Anesthesia Plan  ASA: II  Anesthesia Plan: General   Post-op Pain Management:    Induction: Intravenous  Airway Management Planned: Oral ETT  Additional Equipment:   Intra-op Plan:   Post-operative Plan: Extubation in OR  Informed Consent: I have reviewed the patients History and Physical, chart, labs and discussed the procedure including the risks, benefits and alternatives for the proposed anesthesia with the patient or authorized representative who has indicated his/her understanding and acceptance.   Dental advisory given  Plan Discussed with: CRNA  Anesthesia Plan Comments:        Anesthesia Quick Evaluation

## 2015-12-08 NOTE — H&P (Signed)
Progress Notes      Nunzio Cobbs, MD at 11/24/2015 1:23 PM     Status: Signed       Expand All Collapse All   Patient ID: Theresa Singleton, female DOB: 19-Apr-1971, 45 y.o. MRN: [id number] GYNECOLOGY VISIT  HPI: 45 y.o. Divorced Caucasian female  G1P1001 with Patient's last menstrual period was 11/10/2015 (exact date).  here for surgical consult for left ovarian mass noted on ultrasound.  Pelvic ultrasound 10/30/15: Uterus - no masses. EMS - 7.81 mm. Ovaries - left ovary 34 x 28 mm cystic and solid area with increased vascular flow. Hemorrhagic cyst versus hydrosalpinx. Second cyst 25 x 29 mm - hemorrhagic cyst versus endometrioma. Free fluid - no  CA125 on 10/30/15 - 18.  Prior pelvic ultrasound on 08/07/15 in this office: Ovaries - Right adnexa absent. Left ovary with 2 hemorrhagic cysts - 51 x 45 mm, 3.63 x 3.14 x 2.67 mm. No clear endometrioma.  Unable to do endometrial biopsy on 08/07/15 for menorrhagia due to cervical stenosis.  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  Status post 5 laparoscopies. Status post laparotomy with right salpingo-oophorectomy in 2004 for endometrioma/endometriosis. Had laparotomy due to scar tissue. Partner has had vasectomy.  Declines future childbearing.   Used NuvaRing and Camilla in the past.  Currently on Seasonale. Having pain and heavy clotting. No real improvement on Seasonale. Spotted throughout December 2016 and January 2017. No problems with combined contraception.  No HTN, migraines with aura, liver disease.  Used Depo Lupron in the past and had terrible hot flashes and went on estrogen patch.   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   GYNECOLOGIC HISTORY: Patient's last menstrual period was 11/10/2015 (exact date). Contraception: Vasectomy Menopausal hormone therapy:n/a Last mammogram:  05-01-10 Diag.Left and Left U/S--Neg/BiRads 2/return [facility] screening:[facility]. Patient has appt. today for mammogram at 3:00pm Last pap smear: 07-21-15 Neg:Neg HR HPV   OB History    Gravida Para Term Preterm AB TAB SAB Ectopic Multiple Living   1 1 1       1        There are no active problems to display for this patient.   Past Medical History  Diagnosis Date  . Amenorrhea   . Endometriosis   . Anxiety   . Infertility, female   . Diverticulitis   . Abnormal Pap smear of cervix 2016    CIN I  . Cervical stenosis (uterine cervix)     Past Surgical History  Procedure Laterality Date  . Oophorectomy Right 06/2003     Endometriosis   . Pelvic laparoscopy  2004    several. endometriosis   . Hysteroscopy  2003  . Endometrial ablation  2003    Current Outpatient Prescriptions  Medication Sig Dispense Refill  . glucosamine-chondroitin 500-400 MG tablet Take 1 tablet by mouth daily.    [name] ibuprofen (ADVIL,MOTRIN) 800 MG tablet Take 800 mg by mouth as needed for mild pain.     [name] levonorgestrel-ethinyl estradiol (SEASONALE,INTROVALE,JOLESSA) 0.15-0.03 MG tablet Take 1 tablet by mouth daily. 3 Package 0  . LORazepam (ATIVAN) 0.5 MG tablet Take 0.5 mg by mouth at bedtime as needed for sleep.     . Multiple Vitamin (MULTIVITAMIN) tablet Take 1 tablet by mouth daily.    . NON FORMULARY Take 1 tablet by mouth daily. Calm-aid     No current facility-administered medications for this visit.  ALLERGIES: Aspirin and Codeine  Family History  Problem Relation Age of Onset  . Diabetes Maternal Uncle   . Diabetes Maternal Uncle     Social History   Social History  . Marital Status: Divorced    Spouse Name: N/A  . Number of Children: N/A  . Years of Education: N/A   Occupational History  . Not on file.    Social History Main Topics  . Smoking status: Never Smoker   . Smokeless tobacco: Never Used  . Alcohol Use: 1.2 oz/week    2 Glasses of wine per week  . Drug Use: No  . Sexual Activity:    Partners: Male    Birth Control/ Protection: Surgical     Comment: vasectomy - boyfriend    Other Topics Concern  . Not on file   Social History Narrative    ROS: Pertinent items are noted in HPI.  PHYSICAL EXAMINATION:   Ht 5' 4.5" (1.638 m)  LMP 11/10/2015 (Exact Date)  General appearance: alert, cooperative and appears stated age Head: Normocephalic, without obvious abnormality, atraumatic Neck: no adenopathy, supple, symmetrical, trachea midline and thyroid normal to inspection and palpation Lungs: clear to auscultation bilaterally Heart: regular rate and rhythm Abdomen: Pfannenstiel incision, soft, non-tender; bowel sounds normal; no masses, no organomegaly Extremities: extremities normal, atraumatic, no cyanosis or edema Skin: Skin color, texture, turgor normal. No rashes or lesions Lymph nodes: Cervical, supraclavicular, and axillary nodes normal. No abnormal inguinal nodes palpated Neurologic: Grossly normal  Pelvic: External genitalia: no lesions  Urethra: normal appearing urethra with no masses, tenderness or lesions  Bartholins and Skenes: normal   Vagina: normal appearing vagina with normal color and discharge, no lesions  Cervix: Distorted due to scarring. Palpates normal.   Bimanual Exam: Uterus: normal size, contour, position, consistency, mobility, non-tender  Adnexa: no mass, fullness, tenderness    Chaperone was present for exam.  ASSESSMENT  Menorrhagia and dysmenorrhea. Hx of endometriosis and multiple prior surgeries. Left ovarian cysts. Increase blood flow to one cyst. Inability to sample endometrium due to cervical stenosis.  Hx of cervical endometriosis.   PLAN  Plan for hysteroscopy with dilation and curettage and laparoscopic robotic LSO with collection of pelvic washings.  Risks of laparoscopy include but are not limited to bleeding, infection, damage to surrounding organs, reaction to anesthesia, pneumonia, DVT, PE, death, hernia formation, need to convert to laparotomy, and need for reoperation. Patient is OK with laparotomy with removal of left tube and ovary if needed, and she declines hysterectomy.  She understand that removal of the remaining ovary will result in menopausal symptoms which can be treated with hormonal therapy including estrogen, progesterone, and testosterone.  Surgical incisions reviewed. Has an appointment for mammogram today.  An After Visit Summary was printed and given to the patient.  _25_____ minutes face to face time of which over 50% was spent in counseling.

## 2015-12-09 ENCOUNTER — Encounter (HOSPITAL_COMMUNITY)
Admission: RE | Disposition: A | Discharge status: Home / Self Care | Payer: Self-pay | Source: Ambulatory Visit | Attending: Obstetrics and Gynecology

## 2015-12-09 ENCOUNTER — Ambulatory Visit (HOSPITAL_COMMUNITY): Payer: 59 | Admitting: Anesthesiology

## 2015-12-09 ENCOUNTER — Ambulatory Visit (HOSPITAL_COMMUNITY)
Admission: RE | Admit: 2015-12-09 | Discharge: 2015-12-09 | Disposition: A | Discharge status: Home / Self Care | Payer: 59 | Source: Ambulatory Visit | Attending: Obstetrics and Gynecology | Admitting: Obstetrics and Gynecology

## 2015-12-09 DIAGNOSIS — Z6837 Body mass index (BMI) 37.0-37.9, adult: Secondary | ICD-10-CM | POA: Insufficient documentation

## 2015-12-09 DIAGNOSIS — N920 Excessive and frequent menstruation with regular cycle: Secondary | ICD-10-CM | POA: Diagnosis not present

## 2015-12-09 DIAGNOSIS — M4802 Spinal stenosis, cervical region: Secondary | ICD-10-CM | POA: Diagnosis not present

## 2015-12-09 DIAGNOSIS — N800 Endometriosis of uterus: Secondary | ICD-10-CM | POA: Insufficient documentation

## 2015-12-09 DIAGNOSIS — N946 Dysmenorrhea, unspecified: Secondary | ICD-10-CM | POA: Diagnosis not present

## 2015-12-09 DIAGNOSIS — N803 Endometriosis of pelvic peritoneum: Secondary | ICD-10-CM | POA: Insufficient documentation

## 2015-12-09 DIAGNOSIS — N83202 Unspecified ovarian cyst, left side: Secondary | ICD-10-CM | POA: Insufficient documentation

## 2015-12-09 HISTORY — PX: ROBOTIC ASSISTED SALPINGO OOPHERECTOMY: SHX6082

## 2015-12-09 HISTORY — PX: HYSTEROSCOPY W/D&C: SHX1775

## 2015-12-09 LAB — PREGNANCY, URINE: PREG TEST UR: NEGATIVE

## 2015-12-09 SURGERY — ROBOTIC ASSISTED SALPINGO OOPHORECTOMY
Anesthesia: General | Site: Vagina

## 2015-12-09 MED ORDER — EPHEDRINE SULFATE 50 MG/ML IJ SOLN
INTRAMUSCULAR | Status: DC | PRN
Start: 2015-12-09 — End: 2015-12-09
  Administered 2015-12-09 (×2): 5 mg via INTRAVENOUS

## 2015-12-09 MED ORDER — CEFAZOLIN SODIUM-DEXTROSE 2-3 GM-% IV SOLR
2.0000 g | INTRAVENOUS | Status: AC
Start: 2015-12-09 — End: 2015-12-09
  Administered 2015-12-09: 2 g via INTRAVENOUS

## 2015-12-09 MED ORDER — PROMETHAZINE HCL 25 MG/ML IJ SOLN: 6.2500 mg | INTRAMUSCULAR | Status: DC | PRN

## 2015-12-09 MED ORDER — MIDAZOLAM HCL 2 MG/2ML IJ SOLN
INTRAMUSCULAR | Status: AC
  Filled 2015-12-09: qty 2

## 2015-12-09 MED ORDER — LIDOCAINE HCL (CARDIAC) 20 MG/ML IV SOLN
INTRAVENOUS | Status: AC
  Filled 2015-12-09: qty 5

## 2015-12-09 MED ORDER — DEXAMETHASONE SODIUM PHOSPHATE 10 MG/ML IJ SOLN
INTRAMUSCULAR | Status: DC | PRN
  Administered 2015-12-09: 4 mg via INTRAVENOUS

## 2015-12-09 MED ORDER — HEPARIN SODIUM (PORCINE) 5000 UNIT/ML IJ SOLN
INTRAMUSCULAR | Status: AC
Start: 2015-12-09 — End: 2015-12-09
  Filled 2015-12-09: qty 1

## 2015-12-09 MED ORDER — NEOSTIGMINE METHYLSULFATE 10 MG/10ML IV SOLN
INTRAVENOUS | Status: AC
  Filled 2015-12-09: qty 1

## 2015-12-09 MED ORDER — HYDROCODONE-ACETAMINOPHEN 7.5-325 MG PO TABS: 1.0000 | ORAL_TABLET | Freq: Once | ORAL | Status: DC | PRN

## 2015-12-09 MED ORDER — LACTATED RINGERS IR SOLN
Status: DC | PRN
  Administered 2015-12-09: 3000 mL

## 2015-12-09 MED ORDER — SCOPOLAMINE 1 MG/3DAYS TD PT72
1.0000 | MEDICATED_PATCH | Freq: Once | TRANSDERMAL | Status: DC
  Administered 2015-12-09: 1.5 mg via TRANSDERMAL

## 2015-12-09 MED ORDER — DEXAMETHASONE SODIUM PHOSPHATE 4 MG/ML IJ SOLN
INTRAMUSCULAR | Status: AC
Start: 2015-12-09 — End: 2015-12-09
  Filled 2015-12-09: qty 1

## 2015-12-09 MED ORDER — PROPOFOL 10 MG/ML IV BOLUS
INTRAVENOUS | Status: AC
  Filled 2015-12-09: qty 20

## 2015-12-09 MED ORDER — ROCURONIUM BROMIDE 100 MG/10ML IV SOLN
INTRAVENOUS | Status: AC
  Filled 2015-12-09: qty 1

## 2015-12-09 MED ORDER — ROCURONIUM BROMIDE 100 MG/10ML IV SOLN
INTRAVENOUS | Status: DC | PRN
  Administered 2015-12-09: 40 mg via INTRAVENOUS
  Administered 2015-12-09: 10 mg via INTRAVENOUS

## 2015-12-09 MED ORDER — ONDANSETRON HCL 4 MG/2ML IJ SOLN
INTRAMUSCULAR | Status: AC
  Filled 2015-12-09: qty 2

## 2015-12-09 MED ORDER — LIDOCAINE HCL 1 % IJ SOLN
INTRAMUSCULAR | Status: AC
  Filled 2015-12-09: qty 20

## 2015-12-09 MED ORDER — BUPIVACAINE HCL (PF) 0.25 % IJ SOLN
INTRAMUSCULAR | Status: AC
  Filled 2015-12-09: qty 30

## 2015-12-09 MED ORDER — MIDAZOLAM HCL 2 MG/2ML IJ SOLN
INTRAMUSCULAR | Status: DC | PRN
  Administered 2015-12-09: 2 mg via INTRAVENOUS

## 2015-12-09 MED ORDER — ONDANSETRON HCL 4 MG/2ML IJ SOLN
INTRAMUSCULAR | Status: DC | PRN
  Administered 2015-12-09: 4 mg via INTRAVENOUS

## 2015-12-09 MED ORDER — CEFAZOLIN SODIUM-DEXTROSE 2-3 GM-% IV SOLR
INTRAVENOUS | Status: AC
  Filled 2015-12-09: qty 50

## 2015-12-09 MED ORDER — LIDOCAINE HCL (CARDIAC) 20 MG/ML IV SOLN
INTRAVENOUS | Status: DC | PRN
  Administered 2015-12-09: 100 mg via INTRAVENOUS

## 2015-12-09 MED ORDER — EPHEDRINE 5 MG/ML INJ
INTRAVENOUS | Status: AC
  Filled 2015-12-09: qty 10

## 2015-12-09 MED ORDER — GLYCOPYRROLATE 0.2 MG/ML IJ SOLN
INTRAMUSCULAR | Status: DC | PRN
  Administered 2015-12-09: 0.4 mg via INTRAVENOUS

## 2015-12-09 MED ORDER — HYDROMORPHONE HCL 1 MG/ML IJ SOLN: 0.2500 mg | INTRAMUSCULAR | Status: DC | PRN

## 2015-12-09 MED ORDER — PROPOFOL 10 MG/ML IV BOLUS
INTRAVENOUS | Status: DC | PRN
  Administered 2015-12-09: 180 mg via INTRAVENOUS

## 2015-12-09 MED ORDER — LACTATED RINGERS IV SOLN: INTRAVENOUS | Status: DC

## 2015-12-09 MED ORDER — OXYCODONE-ACETAMINOPHEN 5-325 MG PO TABS: 2.0000 | ORAL_TABLET | ORAL | Status: DC | PRN

## 2015-12-09 MED ORDER — NEOSTIGMINE METHYLSULFATE 10 MG/10ML IV SOLN
INTRAVENOUS | Status: DC | PRN
  Administered 2015-12-09: 3 mg via INTRAVENOUS

## 2015-12-09 MED ORDER — FENTANYL CITRATE (PF) 250 MCG/5ML IJ SOLN
INTRAMUSCULAR | Status: AC
  Filled 2015-12-09: qty 5

## 2015-12-09 MED ORDER — BUPIVACAINE HCL (PF) 0.25 % IJ SOLN
INTRAMUSCULAR | Status: DC | PRN
  Administered 2015-12-09: 7 mL

## 2015-12-09 MED ORDER — KETOROLAC TROMETHAMINE 30 MG/ML IJ SOLN
INTRAMUSCULAR | Status: DC | PRN
  Administered 2015-12-09: 30 mg via INTRAVENOUS

## 2015-12-09 MED ORDER — KETOROLAC TROMETHAMINE 30 MG/ML IJ SOLN: 30.0000 mg | Freq: Once | INTRAMUSCULAR | Status: DC

## 2015-12-09 MED ORDER — ESMOLOL HCL 100 MG/10ML IV SOLN
INTRAVENOUS | Status: AC
  Filled 2015-12-09: qty 10

## 2015-12-09 MED ORDER — LACTATED RINGERS IV SOLN
INTRAVENOUS | Status: DC
  Administered 2015-12-09 (×2): via INTRAVENOUS

## 2015-12-09 MED ORDER — KETOROLAC TROMETHAMINE 30 MG/ML IJ SOLN
INTRAMUSCULAR | Status: AC
  Filled 2015-12-09: qty 1

## 2015-12-09 MED ORDER — GLYCOPYRROLATE 0.2 MG/ML IJ SOLN
INTRAMUSCULAR | Status: AC
  Filled 2015-12-09: qty 3

## 2015-12-09 MED ORDER — ARTIFICIAL TEARS OP OINT
TOPICAL_OINTMENT | OPHTHALMIC | Status: AC
  Filled 2015-12-09: qty 3.5

## 2015-12-09 MED ORDER — LACTATED RINGERS IR SOLN
Status: DC | PRN
  Administered 2015-12-09: 1000 mL/h

## 2015-12-09 MED ORDER — FENTANYL CITRATE (PF) 100 MCG/2ML IJ SOLN
INTRAMUSCULAR | Status: DC | PRN
  Administered 2015-12-09 (×2): 100 ug via INTRAVENOUS
  Administered 2015-12-09: 50 ug via INTRAVENOUS

## 2015-12-09 MED ORDER — SCOPOLAMINE 1 MG/3DAYS TD PT72
MEDICATED_PATCH | TRANSDERMAL | Status: AC
  Administered 2015-12-09: 1.5 mg via TRANSDERMAL
  Filled 2015-12-09: qty 1

## 2015-12-09 SURGICAL SUPPLY — 66 items
APL SRG 38 LTWT LNG FL B (MISCELLANEOUS) ×2
APPLICATOR ARISTA FLEXITIP XL (MISCELLANEOUS) ×2 IMPLANT
BARRIER ADHS 3X4 INTERCEED (GAUZE/BANDAGES/DRESSINGS) IMPLANT
BRR ADH 4X3 ABS CNTRL BYND (GAUZE/BANDAGES/DRESSINGS)
CABLE HIGH FREQUENCY MONO STRZ (ELECTRODE) ×2 IMPLANT
CANISTER SUCT 3000ML (MISCELLANEOUS) ×4 IMPLANT
CATH ROBINSON RED A/P 16FR (CATHETERS) ×4 IMPLANT
CLOSURE WOUND 1/2 X4 (GAUZE/BANDAGES/DRESSINGS) ×1
CLOTH BEACON ORANGE TIMEOUT ST (SAFETY) ×4 IMPLANT
CONT PATH 16OZ SNAP LID 3702 (MISCELLANEOUS) ×5 IMPLANT
CONTAINER PREFILL 10% NBF 60ML (FORM) ×8 IMPLANT
COVER BACK TABLE 60X90IN (DRAPES) ×8 IMPLANT
COVER TIP SHEARS 8 DVNC (MISCELLANEOUS) ×4 IMPLANT
COVER TIP SHEARS 8MM DA VINCI (MISCELLANEOUS) ×2
DECANTER SPIKE VIAL GLASS SM (MISCELLANEOUS) ×8 IMPLANT
DEFOGGER SCOPE WARMER CLEARIFY (MISCELLANEOUS) ×4 IMPLANT
DRAPE ROBOTICS STRL (DRAPES) IMPLANT
DURAPREP 26ML APPLICATOR (WOUND CARE) ×4 IMPLANT
ELECT REM PT RETURN 9FT ADLT (ELECTROSURGICAL) ×4
ELECTRODE REM PT RTRN 9FT ADLT (ELECTROSURGICAL) ×2 IMPLANT
GAUZE VASELINE 3X9 (GAUZE/BANDAGES/DRESSINGS) ×2 IMPLANT
GLOVE BIO SURGEON STRL SZ 6.5 (GLOVE) ×9 IMPLANT
GLOVE BIO SURGEONS STRL SZ 6.5 (GLOVE) ×3
GLOVE BIOGEL PI IND STRL 6.5 (GLOVE) ×2 IMPLANT
GLOVE BIOGEL PI IND STRL 7.0 (GLOVE) ×6 IMPLANT
GLOVE BIOGEL PI INDICATOR 6.5 (GLOVE) ×2
GLOVE BIOGEL PI INDICATOR 7.0 (GLOVE) ×6
GOWN STRL REUS W/TWL LRG LVL3 (GOWN DISPOSABLE) ×8 IMPLANT
HEMOSTAT ARISTA ABSORB 3G PWDR (MISCELLANEOUS) ×3 IMPLANT
KIT ACCESSORY DA VINCI DISP (KITS) ×2
KIT ACCESSORY DVNC DISP (KITS) ×2 IMPLANT
LEGGING LITHOTOMY PAIR STRL (DRAPES) ×4 IMPLANT
LIQUID BAND (GAUZE/BANDAGES/DRESSINGS) ×4 IMPLANT
PACK ROBOT WH (CUSTOM PROCEDURE TRAY) ×4 IMPLANT
PACK ROBOTIC GOWN (GOWN DISPOSABLE) ×4 IMPLANT
PACK VAGINAL MINOR WOMEN LF (CUSTOM PROCEDURE TRAY) ×4 IMPLANT
PAD OB MATERNITY 4.3X12.25 (PERSONAL CARE ITEMS) ×4 IMPLANT
PAD PREP 24X48 CUFFED NSTRL (MISCELLANEOUS) ×6 IMPLANT
PAD TRENDELENBURG POSITION (MISCELLANEOUS) ×4 IMPLANT
SCISSORS LAP 5X35 DISP (ENDOMECHANICALS) ×3 IMPLANT
SET BI-LUMEN FLTR TB AIRSEAL (TUBING) ×4 IMPLANT
SET CYSTO W/LG BORE CLAMP LF (SET/KITS/TRAYS/PACK) IMPLANT
SET IRRIG TUBING LAPAROSCOPIC (IRRIGATION / IRRIGATOR) ×6 IMPLANT
SLEEVE XCEL OPT CAN 5 100 (ENDOMECHANICALS) ×3 IMPLANT
STRIP CLOSURE SKIN 1/2X4 (GAUZE/BANDAGES/DRESSINGS) ×2 IMPLANT
SUT VIC AB 0 CT2 27 (SUTURE) IMPLANT
SUT VIC AB 4-0 PS2 27 (SUTURE) ×8 IMPLANT
SUT VICRYL 0 UR6 27IN ABS (SUTURE) ×8 IMPLANT
SUT VLOC 180 0 9IN  GS21 (SUTURE)
SUT VLOC 180 0 9IN GS21 (SUTURE) IMPLANT
SYR 50ML LL SCALE MARK (SYRINGE) ×8 IMPLANT
SYSTEM CONVERTIBLE TROCAR (TROCAR) IMPLANT
TIP RUMI ORANGE 6.7MMX12CM (TIP) IMPLANT
TIP UTERINE 5.1X6CM LAV DISP (MISCELLANEOUS) IMPLANT
TIP UTERINE 6.7X10CM GRN DISP (MISCELLANEOUS) IMPLANT
TIP UTERINE 6.7X6CM WHT DISP (MISCELLANEOUS) IMPLANT
TIP UTERINE 6.7X8CM BLUE DISP (MISCELLANEOUS) IMPLANT
TOWEL OR 17X24 6PK STRL BLUE (TOWEL DISPOSABLE) ×12 IMPLANT
TROCAR DILATING TIP 12MM 150MM (ENDOMECHANICALS) ×4 IMPLANT
TROCAR DISP BLADELESS 8 DVNC (TROCAR) ×2 IMPLANT
TROCAR DISP BLADELESS 8MM (TROCAR) ×2
TROCAR XCEL NON-BLD 5MMX100MML (ENDOMECHANICALS) ×10 IMPLANT
TUBING AQUILEX INFLOW (TUBING) ×4 IMPLANT
TUBING AQUILEX OUTFLOW (TUBING) ×4 IMPLANT
TUBING INSUFFLATION W/FILTER (TUBING) IMPLANT
WATER STERILE IRR 1000ML POUR (IV SOLUTION) ×12 IMPLANT

## 2015-12-09 NOTE — Transfer of Care (Signed)
Immediate Anesthesia Transfer of Care Note  Patient: Theresa Singleton  Procedure(s) Performed: Procedure(s) with comments: LAPAROSCOPY WITH PELVIC WASHINGS & LYSIS OF ADHESIONS (Left) - Dr. Quincy Simmonds called at 6:45am to tell team not to drape robot out. The team had already set the room up and draped the robot. The case was posted for a robotic salpingo oophorectomy DILATATION AND CURETTAGE /HYSTEROSCOPY (N/A)  Patient Location: PACU  Anesthesia Type:General  Level of Consciousness: awake, alert  and oriented  Airway & Oxygen Therapy: Patient Spontanous Breathing and Patient connected to nasal cannula oxygen  Post-op Assessment: Report given to RN, Post -op Vital signs reviewed and stable and Patient moving all extremities  Post vital signs: Reviewed and stable  Last Vitals:  Filed Vitals:   12/09/15 0627  BP: 115/82  Pulse: 73  Temp: 36.7 C  Resp: 16    Complications: No apparent anesthesia complications

## 2015-12-09 NOTE — Discharge Instructions (Signed)
Hysteroscopy, Care After °Refer to this sheet in the next few weeks. These instructions provide you with information on caring for yourself after your procedure. Your health care provider may also give you more specific instructions. Your treatment has been planned according to current medical practices, but problems sometimes occur. Call your health care provider if you have any problems or questions after your procedure.  °WHAT TO EXPECT AFTER THE PROCEDURE °After your procedure, it is typical to have the following: °· You may have some cramping. This normally lasts for a couple days. °· You may have bleeding. This can vary from light spotting for a few days to menstrual-like bleeding for 3-7 days. °HOME CARE INSTRUCTIONS °· Rest for the first 1-2 days after the procedure. °· Only take over-the-counter or prescription medicines as directed by your health care provider. Do not take aspirin. It can increase the chances of bleeding. °· Take showers instead of baths for 2 weeks or as directed by your health care provider. °· Do not drive for 24 hours or as directed. °· Do not drink alcohol while taking pain medicine. °· Do not use tampons, douche, or have sexual intercourse for 2 weeks or until your health care provider says it is okay. °· Take your temperature twice a day for 4-5 days. Write it down each time. °· Follow your health care provider's advice about diet, exercise, and lifting. °· If you develop constipation, you may: °· Take a mild laxative if your health care provider approves. °· Add bran foods to your diet. °· Drink enough fluids to keep your urine clear or pale yellow. °· Try to have someone with you or available to you for the first 24-48 hours, especially if you were given a general anesthetic. °· Follow up with your health care provider as directed. °SEEK MEDICAL CARE IF: °· You feel dizzy or lightheaded. °· You feel sick to your stomach (nauseous). °· You have abnormal vaginal discharge. °· You  have a rash. °· You have pain that is not controlled with medicine. °SEEK IMMEDIATE MEDICAL CARE IF: °· You have bleeding that is heavier than a normal menstrual period. °· You have a fever. °· You have increasing cramps or pain, not controlled with medicine. °· You have new belly (abdominal) pain. °· You pass out. °· You have pain in the tops of your shoulders (shoulder strap areas). °· You have shortness of breath. °  °This information is not intended to replace advice given to you by your health care provider. Make sure you discuss any questions you have with your health care provider. °  °Document Released: 06/27/2013 Document Reviewed: 06/27/2013 °Elsevier Interactive Patient Education ©2016 [employer] Inc[employer] ° °Dilation and Curettage or Vacuum Curettage, Care After °Refer to this sheet in the next few weeks. These instructions provide you with information on caring for yourself after your procedure. Your health care provider may also give you more specific instructions. Your treatment has been planned according to current medical practices, but problems sometimes occur. Call your health care provider if you have any problems or questions after your procedure. °WHAT TO EXPECT AFTER THE PROCEDURE °After your procedure, it is typical to have light cramping and bleeding. This may last for 2 days to 2 weeks after the procedure. °HOME CARE INSTRUCTIONS  °· Do not drive for 24 hours. °· Wait 1 week before returning to strenuous activities. °· Take your temperature 2 times a day for 4 days and write it down. Provide these temperatures to   your health care provider if you develop a fever.  Avoid long periods of standing.  Avoid heavy lifting, pushing, or pulling. Do not lift anything heavier than 10 pounds (4.5 kg).  Limit stair climbing to once or twice a day.  Take rest periods often.  You may resume your usual diet.  Drink enough fluids to keep your urine clear or pale yellow.  Your usual bowel function  should return. If you have constipation, you may:  Take a mild laxative with permission from your health care provider.  Add fruit and bran to your diet.  Drink more fluids.  Take showers instead of baths until your health care provider gives you permission to take baths.  Do not go swimming or use a hot tub until your health care provider approves.  Try to have someone with you or available to you the first 24-48 hours, especially if you were given a general anesthetic.  Do not douche, use tampons, or have sex (intercourse) for 2 weeks after the procedure.  Only take over-the-counter or prescription medicines as directed by your health care provider. Do not take aspirin. It can cause bleeding.  Follow up with your health care provider as directed. SEEK MEDICAL CARE IF:   You have increasing cramps or pain that is not relieved with medicine.  You have abdominal pain that does not seem to be related to the same area of earlier cramping and pain.  You have bad smelling vaginal discharge.  You have a rash.  You are having problems with any medicine. SEEK IMMEDIATE MEDICAL CARE IF:   You have bleeding that is heavier than a normal menstrual period.  You have a fever.  You have chest pain.  You have shortness of breath.  You feel dizzy or feel like fainting.  You pass out.  You have pain in your shoulder strap area.  You have heavy vaginal bleeding with or without blood clots. MAKE SURE YOU:   Understand these instructions.  Will watch your condition.  Will get help right away if you are not doing well or get worse.   This information is not intended to replace advice given to you by your health care provider. Make sure you discuss any questions you have with your health care provider.   Document Released: 09/03/2000 Document Revised: 09/11/2013 Document Reviewed: 04/05/2013 Elsevier Interactive Patient Education 2016 [employer] Inc[employer]  Diagnostic Laparoscopy A  diagnostic laparoscopy is a procedure to diagnose diseases in the abdomen. During the procedure, a thin, lighted, pencil-sized instrument called a laparoscope is inserted into the abdomen through an incision. The laparoscope allows your health care provider to look at the organs inside your body. LET [facility] CARE PROVIDER KNOW ABOUT:  Any allergies you have.  All medicines you are taking, including vitamins, herbs, eye drops, creams, and over-the-counter medicines.  Previous problems you or members of your family have had with the use of anesthetics.  Any blood disorders you have.  Previous surgeries you have had.  Medical conditions you have. RISKS AND COMPLICATIONS  Generally, this is a safe procedure. However, problems can occur, which may include:  Infection.  Bleeding.  Damage to other organs.  Allergic reaction to the anesthetics used during the procedure. BEFORE THE PROCEDURE  Do not eat or drink anything after midnight on the night before the procedure or as directed by your health care provider.  Ask your health care provider about:  Changing or stopping your regular medicines.  Taking medicines such  as aspirin and ibuprofen. These medicines can thin your blood. Do not take these medicines before your procedure if your health care provider instructs you not to.  Plan to have someone take you home after the procedure. PROCEDURE  You may be given a medicine to help you relax (sedative).  You will be given a medicine to make you sleep (general anesthetic).  Your abdomen will be inflated with a gas. This will make your organs easier to see.  Small incisions will be made in your abdomen.  A laparoscope and other small instruments will be inserted into the abdomen through the incisions.  A tissue sample may be removed from an organ in the abdomen for examination.  The instruments will be removed from the abdomen.  The gas will be released.  The incisions  will be closed with stitches (sutures). AFTER THE PROCEDURE  Your blood pressure, heart rate, breathing rate, and blood oxygen level will be monitored often until the medicines you were given have worn off.   This information is not intended to replace advice given to you by your health care provider. Make sure you discuss any questions you have with your health care provider.   Document Released: 12/13/2000 Document Revised: 05/28/2015 Document Reviewed: 04/19/2014 Elsevier Interactive Patient Education 2016 [facility] Instructions  Activity: Get plenty of rest for the remainder of the day. A responsible adult should stay with you for 24 hours following the procedure.  For the next 24 hours, DO NOT: -Drive a car -Paediatric nurse -Drink alcoholic beverages -Take any medication unless instructed by your physician -Make any legal decisions or sign important papers.  Meals: Start with liquid foods such as gelatin or soup. Progress to regular foods as tolerated. Avoid greasy, spicy, heavy foods. If nausea and/or vomiting occur, drink only clear liquids until the nausea and/or vomiting subsides. Call your physician if vomiting continues.  Special Instructions/Symptoms: Your throat may feel dry or sore from the anesthesia or the breathing tube placed in your throat during surgery. If this causes discomfort, gargle with warm salt water. The discomfort should disappear within 24 hours.  If you had a scopolamine patch placed behind your ear for the management of post- operative nausea and/or vomiting:  1. The medication in the patch is effective for 72 hours, after which it should be removed.  Wrap patch in a tissue and discard in the trash. Wash hands thoroughly with soap and water. 2. You may remove the patch earlier than 72 hours if you experience unpleasant side effects which may include dry mouth, dizziness or visual disturbances. 3. Avoid touching the  patch. Wash your hands with soap and water after contact with the patch.

## 2015-12-09 NOTE — Op Note (Signed)
NAMECASSANDREA, BAGNASCO NO.:  [id number]  MEDICAL RECORD NO.:  [id number]  LOCATION:  [facility]                          FACILITY:  [facility]  PHYSICIAN:  Lenard Galloway, M.D.   DATE OF BIRTH:  June 22, 1971  DATE OF PROCEDURE:  12/09/2015 DATE OF DISCHARGE:                              OPERATIVE REPORT   PREOPERATIVE DIAGNOSES:  Menorrhagia, dysmenorrhea, history of endometriosis, stenotic cervix, left adnexal mass.  POSTOPERATIVE DIAGNOSIS:  Stage IV endometriosis.  PROCEDURES:  Hysteroscopy with dilation and curettage, laparoscopy with collection of pelvic washings and lysis of adhesions.  SURGEON:  Lenard Galloway, M.D.  ASSISTANT:  Sumner Boast, M.D.  ANESTHESIA:  General endotracheal, local with 0.25% Marcaine.  IV FLUIDS:  1000 mL Ringer's lactate.  ESTIMATED BLOOD LOSS:  25 mL.  URINE OUTPUT:  150 mL.  COMPLICATIONS:  None.  Normal saline deficit for hysteroscopy 50 mL.  INDICATIONS FOR THE PROCEDURE:  The patient is a 45 year old, gravida 1, para 2 Caucasian female, who presents with a left ovarian mass noted on ultrasound.  The patient has a long history of endometriosis and has had multiple prior laparoscopies and a laparotomy for right salpingo- oophorectomy.  The patient has menorrhagia and dysmenorrhea and pelvic ultrasound on October 30, 2015 has documented a left ovary with cystic and solid areas including increased blood flow.  The patient had a normal CA-125 of 18 on October 30, 2015.  The patient is currently on combined oral contraceptives and continues to have pain and bleeding. Endometrial biopsy in the office was not possible due to cervical stenosis.  Plan is now made to proceed with a hysteroscopy with dilation and curettage and laparoscopy with possible robotic assistance for a left salpingo-oophorectomy and collection of pelvic washings.  The patient has not consented to hysterectomy procedure.  Risks, benefits, and alternatives have  been reviewed with the patient, who wishes to proceed.  FINDINGS:  Examine under anesthesia revealed a small mobile uterus.  No adnexal masses were appreciated.  At the time of the dilation and curettage, the patient was noted to have a very distorted cervix, which was flushed with the vaginal apex.  When the uterine cavity was visualized with hysteroscopy, the patient was noted to have some indentation of the left anterior and lateral fundal walls which could be consistent with possible underlying small submucous fibroids.  There were no obvious endometrial polyps.  At the time of laparoscopy, the patient was noted to have stage IV endometriosis.  There was no identifiable cul-de-sac.  The patient had the left tube and ovary concealed by the bowel along the left pelvic sidewall.  Through dissection, the fimbriae were visible.  Throughout the pelvis, the patient had brownish staining of endometriosis of the peritoneum.  It was not possible to confirm the absence of the right tube and ovary.  The uterus appeared to be retroverted and actually fairly fixed.  In the upper abdomen, the liver was unremarkable.  There was no evidence of any upper abdominal adhesive disease.  There was no evidence of any ascites fluid upon entry into the peritoneal cavity, although there was some bloody fluid consistent with a hysteroscopy that was just performed.  SPECIMENS:  Endometrial curettings were sent to pathology separate from the pelvic washings.  DESCRIPTION OF PROCEDURE:  The patient was reidentified in the preoperative hold area.  She received Ancef 2 g IV for antibiotic prophylaxis.  She received TED hose and PAS stockings for DVT prophylaxis.  The patient was escorted to the operating room, where she was placed in the supine position on the operating room table.  The patient was placed in the dorsal lithotomy position with Allen stirrups prior to undergoing general endotracheal  anesthesia.  The arms were padded and protected at her sides.  An exam under anesthesia was performed.  The patient underwent her sterile prep of the abdomen and vagina and then she was sterilely draped.  The procedure began by placing a Foley catheter in the bladder and this was left to gravity drainage throughout the procedure.  The speculum was then placed in the vagina and the cervix was identified.  The single- tooth tenaculum was placed on the anterior cervical lip and through gentle probing, the cervical os could be identified and the uterus was sounded to approximately 6.5 cm.  The cervix was then dilated.  The hysteroscope was inserted under the continuous infusion of normal saline and the findings are as noted above.  The hysteroscope was withdrawn and the cervix was further dilated with [facility] dilators.  A small serrated curette was then introduced into the uterine cavity and endometrial curettings were obtained and sent to Pathology.  A uterine sound was reintroduced into the uterine cavity and Steri-Strips were used to secure the uterine sound to the single-tooth tenaculum.  Attention was turned to the abdomen at this time.  The patient's prior infraumbilical scar was injected with 0.25% Marcaine.  A 5-mm incision was created and a Veress needle was then used to penetrate the abdominal wall.  The saline drop test was performed and the fluid flowed freely. CO2 pneumoperitoneum was achieved.  The Veress needle was removed and the 5-mm laparoscope was placed with an Optiview trocar.  The patient was placed in Trendelenburg position at this time.  A 5-mm right and left lower quadrant trocars were each placed under direct visualization of the laparoscope after the skin was injected locally with 0.25% Marcaine and the incisions created.  An exploration of the pelvis was performed.  The pelvic washings were obtained initially upon entry of the 1st trocar into the  peritoneal cavity.  This was sent to Pathology.  A 3rd 5-mm laparoscopic trocar was placed in the midline along the patient's prior Pfannenstiel incision.  Again, the skin was injected locally with 0.25% Marcaine and the trocar was placed under visualization of the laparoscope.  Lysis of adhesions was performed sharply and with the [place] grasper.  It was clear that for removal of the left adnexal region, the patient was going to encounter potential bleeding and risk of hysterectomy and a decision was made to discontinue the procedure at this time.  Arista was sprinkled into the posterior cul-de-sac region and around the area of dissection adjacent to the left adnexal region.  Hemostasis was good at this time.  The laparoscopic instruments were removed under visualization of the laparoscope and the CO2 pneumoperitoneum was released.  The patient received manual breaths to remove any remaining CO2 gas before the umbilical trocar was removed.  All skin incisions were closed with subcuticular sutures of 4-0 Vicryl and then Dermabond was placed over the incisions.  The vaginal instruments and Foley catheter were removed.  The patient  was awakened, extubated, and escorted to the recovery room in stable and awake condition.  There were no complications to the procedure.  All needle, instrument, and sponge counts were correct.     Lenard Galloway, M.D.     BES/MEDQ  D:  12/09/2015  T:  12/09/2015  Job:  [id number]

## 2015-12-09 NOTE — Anesthesia Postprocedure Evaluation (Signed)
Anesthesia Post Note  Patient: [name]  Procedure(s) Performed: Procedure(s) (LRB): LAPAROSCOPY WITH PELVIC WASHINGS & LYSIS OF ADHESIONS (Left) DILATATION AND CURETTAGE /HYSTEROSCOPY (N/A)  Patient location during evaluation: PACU Anesthesia Type: General Level of consciousness: awake and alert Pain management: pain level controlled Vital Signs Assessment: post-procedure vital signs reviewed and stable Respiratory status: spontaneous breathing, nonlabored ventilation, respiratory function stable and patient connected to nasal cannula oxygen Cardiovascular status: blood pressure returned to baseline and stable Postop Assessment: no signs of nausea or vomiting Anesthetic complications: no    Last Vitals:  Filed Vitals:   12/09/15 1015 12/09/15 1050  BP: 109/60 107/62  Pulse: 88 80  Temp: 36.7 C 36.4 C  Resp: 16 16    Last Pain:  Filed Vitals:   12/09/15 1106  PainSc: 2                  Theresa Singleton

## 2015-12-09 NOTE — Brief Op Note (Signed)
12/09/2015  9:14 AM  PATIENT:  Theresa Singleton  45 y.o. female  PRE-OPERATIVE DIAGNOSIS:  menorrhagia, dysmenorrhea, history of endometriosis, stenotic cervix  POST-OPERATIVE DIAGNOSIS:  stage IV endometriosis  PROCEDURE:   Hysteroscopy with dilation and curettage, Laparoscopy with lysis of adhesions and collection of pelvic washings. SURGEON:  Surgeon(s) and Role:    * Laurielle Selmon E Yisroel Ramming, MD - Primary    * Salvadore Dom, MD - Assisting  PHYSICIAN ASSISTANT: NA  ASSISTANTS: Salvadore Dom, MD   ANESTHESIA:   local and general, .25% Marcaine.   EBL:  Total I/O In: 1000 [I.V.:1000] Out: 175 [Urine:150; Blood:25]  BLOOD ADMINISTERED:none   NS deficit for hysteroscopy:  50 cc.  DRAINS: none   LOCAL MEDICATIONS USED:  MARCAINE     SPECIMEN:  Source of Specimen:  pelvis washings, endometrial curettings.  DISPOSITION OF SPECIMEN:  PATHOLOGY  COUNTS:  YES  TOURNIQUET:  * No tourniquets in log *  DICTATION: .Other Dictation: Dictation Number    PLAN OF CARE: Discharge to home after PACU  PATIENT DISPOSITION:  PACU - hemodynamically stable.   Delay start of Pharmacological VTE agent (>24hrs) due to surgical blood loss or risk of bleeding: not applicable

## 2015-12-09 NOTE — Progress Notes (Signed)
Update to History and Physical  No marked change in status since office preop visit.   Patient examined.   OK to proceed.  

## 2015-12-09 NOTE — Anesthesia Procedure Notes (Signed)
Procedure Name: Intubation Date/Time: 12/09/2015 7:28 AM Performed by: Hewitt Blade Pre-anesthesia Checklist: Patient identified, Emergency Drugs available, Suction available and Patient being monitored Patient Re-evaluated:Patient Re-evaluated prior to inductionOxygen Delivery Method: Circle system utilized Preoxygenation: Pre-oxygenation with 100% oxygen Intubation Type: IV induction Ventilation: Mask ventilation without difficulty Laryngoscope Size: Mac and 3 Grade View: Grade I Tube type: Oral Tube size: 7.0 mm Number of attempts: 1 Airway Equipment and Method: Stylet Placement Confirmation: ETT inserted through vocal cords under direct vision,  positive ETCO2 and breath sounds checked- equal and bilateral Secured at: 20 cm Tube secured with: Tape Dental Injury: Teeth and Oropharynx as per pre-operative assessment

## 2015-12-10 ENCOUNTER — Encounter (HOSPITAL_COMMUNITY): Payer: Self-pay | Admitting: Obstetrics and Gynecology

## 2015-12-15 ENCOUNTER — Ambulatory Visit (INDEPENDENT_AMBULATORY_CARE_PROVIDER_SITE_OTHER): Payer: 59 | Admitting: Obstetrics and Gynecology

## 2015-12-15 ENCOUNTER — Encounter: Payer: Self-pay | Admitting: Obstetrics and Gynecology

## 2015-12-15 VITALS — Ht 64.5 in | Wt 214.0 lb

## 2015-12-15 DIAGNOSIS — N83202 Unspecified ovarian cyst, left side: Secondary | ICD-10-CM | POA: Diagnosis not present

## 2015-12-15 DIAGNOSIS — N809 Endometriosis, unspecified: Secondary | ICD-10-CM | POA: Diagnosis not present

## 2015-12-15 NOTE — Progress Notes (Signed)
Patient ID: Theresa Singleton, female   DOB: 05/08/1971, 45 y.o.   MRN: [id number]  GYNECOLOGY  VISIT   HPI: 45 y.o.   Divorced  Caucasian  female   G1P1001 with Patient's last menstrual period was 11/10/2015 (exact date).   here for post-op  LAPAROSCOPY WITH PELVIC WASHINGS & LYSIS OF ADHESIONS (Left Abdomen) DILATATION AND CURETTAGE /HYSTEROSCOPY   Patient had surgical findings of stage IV endometriosis with obliteration of the cul de sac.   Patient had declined hysterectomy preop. Pathology showed a benign endometrial polyp and benign pelvic washings.  Patient has been followed for known endometriosis history with multiple prior surgeries (including prior RSO), menorrhagia, dysmenorrhea, and ultrasound 10/30/15 showing 2 left ovarian masses:  1)  34 x 28 mm with cystic and solid area with increased vascular flow - hemorrhagic cyst versus hydrosalpinx. 2)  25 x 29 mm cyst - hemorrhagic versus endometrioma.    CA125 18 on 10/30/15.  Going to [employer] in April for training.   GYNECOLOGIC HISTORY: Patient's last menstrual period was 11/10/2015 (exact date). Contraception:Vasectomy Menopausal hormone therapy: N/A Last mammogram: 11/24/15, scattered areas of fibroglandular density, Bi-Rads 1:  [facility]; [facility] Last pap smear: 07/21/15, negative with neg HR HPV        OB History    Gravida Para Term Preterm AB TAB SAB Ectopic Multiple Living   1 1 1       1          There are no active problems to display for this patient.   Past Medical History  Diagnosis Date  . Amenorrhea   . Endometriosis   . Anxiety   . Infertility, female   . Diverticulitis   . Abnormal Pap smear of cervix 2016    CIN I  . Cervical stenosis (uterine cervix)     Past Surgical History  Procedure Laterality Date  . Oophorectomy Right 06/2003     Endometriosis   . Pelvic laparoscopy  2004    several. endometriosis   . Hysteroscopy  2003  . Endometrial ablation  2003  . Robotic assisted salpingo  oopherectomy Left 12/09/2015    Procedure: LAPAROSCOPY WITH PELVIC WASHINGS & LYSIS OF ADHESIONS;  Surgeon: Nunzio Cobbs, MD;  Location: [facility];  Service: Gynecology;  Laterality: Left;  Dr. Quincy Simmonds called at 6:45am to tell team not to drape robot out. The team had already set the room up and draped the robot. The case was posted for a robotic salpingo oophorectomy  . Hysteroscopy w/d&c N/A 12/09/2015    Procedure: DILATATION AND CURETTAGE /HYSTEROSCOPY;  Surgeon: Nunzio Cobbs, MD;  Location: [facility];  Service: Gynecology;  Laterality: N/A;    Current Outpatient Prescriptions  Medication Sig Dispense Refill  . glucosamine-chondroitin 500-400 MG tablet Take 1 tablet by mouth daily.    [name] ibuprofen (ADVIL,MOTRIN) 800 MG tablet Take 800 mg by mouth as needed for mild pain.     [name] levonorgestrel-ethinyl estradiol (SEASONALE,INTROVALE,JOLESSA) 0.15-0.03 MG tablet Take 1 tablet by mouth daily. 3 Package 0  . LORazepam (ATIVAN) 0.5 MG tablet Take 0.5 mg by mouth at bedtime as needed for sleep.     . Multiple Vitamin (MULTIVITAMIN) tablet Take 1 tablet by mouth daily.    . NON FORMULARY Take 1 tablet by mouth daily. Calm-aid    . oxyCODONE-acetaminophen (PERCOCET) 5-325 MG tablet Take 2 tablets by mouth every 4 (four) hours as needed. use only as much as needed to relieve pain  30 tablet 0   No current facility-administered medications for this visit.     ALLERGIES: Aspirin and Codeine  Family History  Problem Relation Age of Onset  . Diabetes Maternal Uncle   . Diabetes Maternal Uncle     Social History   Social History  . Marital Status: Divorced    Spouse Name: N/A  . Number of Children: N/A  . Years of Education: N/A   Occupational History  . Not on file.   Social History Main Topics  . Smoking status: Never Smoker   . Smokeless tobacco: Never Used  . Alcohol Use: 1.2 oz/week    2 Glasses of wine per week  . Drug Use: No  . Sexual Activity:    Partners: Male     Birth Control/ Protection: Surgical     Comment: vasectomy - boyfriend    Other Topics Concern  . Not on file   Social History Narrative    ROS:  Pertinent items are noted in HPI.  PHYSICAL EXAMINATION:    Ht 5' 4.5" (1.638 m)  LMP 11/10/2015 (Exact Date)    General appearance: alert, cooperative and appears stated age   ASSESSMENT  Status post hysteroscopy with dilation and curettage and laparoscopy with lysis of adhesions and collection of pelvic washings.  Stage IV endometriosis.  Left ovarian cysts - one with increased blood flow and a solid component to it.  Normal CA125.  PLAN  Counseled regarding intraoperative findings. Hysteroscopy and laparoscopy photos shared.  Patient and I discussed the stage IV endometriosis and the extensive nature of the scarring which presented significant risk of hysterectomy with attempted removal of only the left tube and ovary.  We discussed laparotomy with total abdominal hysterectomy with left salpingo-oophorectomy in a planned fashion.  Risks and benefits reviewed.  We discussed a type and screen for hysterectomy as there is risk of transfusion.   We will proceed with a pelvic ultrasound at [facility] [facility] to re-evaluate the left ovary.  This will help with timing of surgical planning.  Questions invited and answered.   An After Visit Summary was printed and given to the patient.  __25____ minutes face to face time of which over 50% was spent in counseling.

## 2015-12-15 NOTE — Progress Notes (Signed)
Scheduled patient while in office for US pelvic complete and Non OB transvaginal ultrasound at [facility] on 12/17/2015 at 3:40 pm. Patient is agreeable to date and time. She is aware that she will need to drink 32 ounces of water starting 1 hour prior to her exam and not to empty her bladder until after her scan is performed. She verbalizes understanding of instructions.

## 2015-12-16 ENCOUNTER — Encounter: Payer: Self-pay | Admitting: Obstetrics and Gynecology

## 2015-12-17 ENCOUNTER — Ambulatory Visit
Admission: RE | Admit: 2015-12-17 | Discharge: 2015-12-17 | Disposition: A | Discharge status: Home / Self Care | Payer: 59 | Source: Ambulatory Visit | Attending: Obstetrics and Gynecology | Admitting: Obstetrics and Gynecology

## 2015-12-17 DIAGNOSIS — N809 Endometriosis, unspecified: Secondary | ICD-10-CM

## 2015-12-17 DIAGNOSIS — N83202 Unspecified ovarian cyst, left side: Secondary | ICD-10-CM

## 2015-12-18 ENCOUNTER — Telehealth: Payer: Self-pay | Admitting: Obstetrics and Gynecology

## 2015-12-18 NOTE — Telephone Encounter (Signed)
Phone call to patient regarding pelvic ultrasound results.   Left ovary measuring 4.7 cm in largest diameter with simple cyst measuring 2.9 cm in largest diameter. No solid area noted. Uterus normal with EMS 6.7 mm.  Patient given reassurance regarding the simple ovarian cyst.   Has stage IV endometriosis and bleeding on OCPs and continued pain.   I discussed options for her - proceeding forward with TAH/LSO at her pace, Depo Provera, or Depo Lupron.  She will consider options and contact the office back.  She does have a preop appointment for mid April.  She is very appreciative of the good news.

## 2015-12-29 ENCOUNTER — Encounter: Payer: Self-pay | Admitting: Obstetrics and Gynecology

## 2015-12-29 ENCOUNTER — Ambulatory Visit (INDEPENDENT_AMBULATORY_CARE_PROVIDER_SITE_OTHER): Payer: 59 | Admitting: Obstetrics and Gynecology

## 2015-12-29 VITALS — BP 110/80 | HR 66 | Ht 64.5 in | Wt 215.4 lb

## 2015-12-29 DIAGNOSIS — N809 Endometriosis, unspecified: Secondary | ICD-10-CM

## 2015-12-29 NOTE — Progress Notes (Signed)
Patient ID: DELLA LANASA, female   DOB: [birth year], 45 y.o.   MRN: [id number] GYNECOLOGY  VISIT   HPI: 45 y.o.   Divorced  Caucasian  female   G1P1001 with Patient's last menstrual period was 11/10/2015 (approximate).   here for consultation.     Considering hysterectomy and definitive surgery in June 2017 for abnormal bleeding on combined oral contraceptives, pelvic pain, and stage IV endometriosis. Used Depo Lupron in the past and used estrogen patch for hot flashes.  Long hx of laparoscopies and prior laparotomy for endometriosis.   Had recent hysteroscopy with dilation and curettage and laparoscopic LOA and collection of pelvic washings.  Unable to do LSO without concern for potential hysterectomy, which the patient was declining at the time of her laparoscopy.  Surgery was discontinued.   Had a post op pelvic ultrasound at [facility] to re-evaluate the left ovary which was noted at office ultrasound preop to have abnormal blood flow to a complex ovarian cyst  CLINICAL DATA: Ovarian cyst. Endometriosis.  EXAM: TRANSABDOMINAL AND TRANSVAGINAL ULTRASOUND OF PELVIS  TECHNIQUE: Both transabdominal and transvaginal ultrasound examinations of the pelvis were performed. Transabdominal technique was performed for global imaging of the pelvis including uterus, ovaries, adnexal regions, and pelvic cul-de-sac. It was necessary to proceed with endovaginal exam following the transabdominal exam to visualize the endometrium.  COMPARISON: 10/30/2015.  FINDINGS: Uterus  Measurements: 8.4 x 5.0 x 5.7 cm. No fibroids or other mass visualized.  Endometrium  Thickness: 6.7 mm. No focal abnormality visualized.  Right ovary  Measurements: Right oophorectomy.  Left ovary  Measurements: 4.7 x 2.5 x 2.5 cm. 2.3 x 2.4 x 2.9 cm simple cyst. No solid or complex mass noted on today's exam.  Other findings  Trace free fluid.  IMPRESSION: 1. Right  oophorectomy.  2. 2.3 x 2.4 x 2.9 cm simple cyst left ovary. No solid or complex mass noted on today's exam.  3. Trace free pelvic fluid.   Electronically Signed  By: Marcello Moores Register  On: 12/17/2015 16:29    GYNECOLOGIC HISTORY: Patient's last menstrual period was 11/10/2015 (approximate).--pt. States she has been spotting since surgery. Contraception:  Vasectomy Menopausal hormone therapy:  n/a Last mammogram:  11/24/15, scattered areas of fibroglandular density, Bi-Rads 1: Negative; [facility] Last pap smear: 07/21/15, negative with neg HR HPV        OB History    Gravida Para Term Preterm AB TAB SAB Ectopic Multiple Living   1 1 1       1          There are no active problems to display for this patient.   Past Medical History  Diagnosis Date  . Amenorrhea   . Endometriosis   . Anxiety   . Infertility, female   . Diverticulitis   . Abnormal Pap smear of cervix 2016    CIN I  . Cervical stenosis (uterine cervix)     Past Surgical History  Procedure Laterality Date  . Oophorectomy Right 06/2003     Endometriosis   . Pelvic laparoscopy  2004    several. endometriosis   . Hysteroscopy  2003  . Endometrial ablation  2003  . Robotic assisted salpingo oopherectomy Left 12/09/2015    Procedure: LAPAROSCOPY WITH PELVIC WASHINGS & LYSIS OF ADHESIONS;  Surgeon: Nunzio Cobbs, MD;  Location: [facility];  Service: Gynecology;  Laterality: Left;  Dr. Quincy Simmonds called at 6:45am to tell team not to drape robot out. The team had already set  the room up and draped the robot. The case was posted for a robotic salpingo oophorectomy  . Hysteroscopy w/d&c N/A 12/09/2015    Procedure: DILATATION AND CURETTAGE /HYSTEROSCOPY;  Surgeon: Nunzio Cobbs, MD;  Location: [facility];  Service: Gynecology;  Laterality: N/A;    Current Outpatient Prescriptions  Medication Sig Dispense Refill  . glucosamine-chondroitin 500-400 MG tablet Take 1 tablet by mouth daily.    [name]  ibuprofen (ADVIL,MOTRIN) 800 MG tablet Take 800 mg by mouth as needed for mild pain.     [name] levonorgestrel-ethinyl estradiol (SEASONALE,INTROVALE,JOLESSA) 0.15-0.03 MG tablet Take 1 tablet by mouth daily. 3 Package 0  . LORazepam (ATIVAN) 0.5 MG tablet Take 0.5 mg by mouth at bedtime as needed for sleep.     . Multiple Vitamin (MULTIVITAMIN) tablet Take 1 tablet by mouth daily.    . NON FORMULARY Take 1 tablet by mouth daily. Calm-aid    . oxyCODONE-acetaminophen (PERCOCET) 5-325 MG tablet Take 2 tablets by mouth every 4 (four) hours as needed. use only as much as needed to relieve pain (Patient not taking: Reported on 12/29/2015) 30 tablet 0   No current facility-administered medications for this visit.     ALLERGIES: Aspirin and Codeine  Family History  Problem Relation Age of Onset  . Diabetes Maternal Uncle   . Diabetes Maternal Uncle     Social History   Social History  . Marital Status: Divorced    Spouse Name: N/A  . Number of Children: N/A  . Years of Education: N/A   Occupational History  . Not on file.   Social History Main Topics  . Smoking status: Never Smoker   . Smokeless tobacco: Never Used  . Alcohol Use: 1.2 oz/week    2 Glasses of wine per week  . Drug Use: No  . Sexual Activity:    Partners: Male    Birth Control/ Protection: Surgical     Comment: vasectomy - boyfriend    Other Topics Concern  . Not on file   Social History Narrative    ROS:  Pertinent items are noted in HPI.  PHYSICAL EXAMINATION:    BP 110/80 mmHg  Pulse 66  Ht 5' 4.5" (1.638 m)  Wt 215 lb 6.4 oz (97.705 kg)  BMI 36.42 kg/m2  LMP 11/10/2015 (Approximate)    General appearance: alert, cooperative and appears stated age  ASSESSMENT  Stage IV endometriosis.  Hx cervical stenosis and distortion. Status post laparoscopic LOA, hysteroscopy with dilation and curettage.  PLAN  Discussion of options for care of treating endometriosis - switch to another combined oral  contraceptive, Depo Lupron, TAH/BSO.  Mirena not a good option due to cervical distortion. Will proceed with planning for TAH/LSO/LOA.  I have discussed risks with the patient which include but are not limited to bleeding, transfusion, damage to surrounding organs requiring further surgical care including bowel resection and urologic surgery, DVT, PE, menopausal symptoms, and continued pain following surgery. Will need type and screen. Surgical expectations and recovery discussed.  Patient wishes to proceed in early June 2017.  We discussed ERT post surgery. Patient expressed appreciation for the time I spend in discussion with her regarding her care.   An After Visit Summary was printed and given to the patient.  ___40___ minutes face to face time of which over 50% was spent in counseling.

## 2016-01-06 ENCOUNTER — Telehealth: Payer: Self-pay | Admitting: Obstetrics and Gynecology

## 2016-01-06 NOTE — Telephone Encounter (Signed)
Patient is calling to speak with the surgery scheduler.

## 2016-01-06 NOTE — Telephone Encounter (Signed)
Call patient to discuss benefits for a procedure. Left Voicemail requesting a call.

## 2016-01-07 NOTE — Telephone Encounter (Signed)
Reviewed benefits with patient. She has provided $250 deposit. Spoke with patient regarding benefit for surgery. Patient understood and agreeable. Patient ready to schedule. Patient provided surgery deposit over the phone. Patient aware this is professional benefit only. Patient aware will be contacted by hospital for separate benefits. Chart to [facility] for scheduling. Ok to close

## 2016-01-26 ENCOUNTER — Ambulatory Visit (INDEPENDENT_AMBULATORY_CARE_PROVIDER_SITE_OTHER): Payer: 59 | Admitting: Obstetrics and Gynecology

## 2016-01-26 ENCOUNTER — Encounter: Payer: Self-pay | Admitting: Obstetrics and Gynecology

## 2016-01-26 VITALS — BP 110/72 | HR 70 | Ht 64.5 in | Wt 216.8 lb

## 2016-01-26 DIAGNOSIS — N809 Endometriosis, unspecified: Secondary | ICD-10-CM

## 2016-01-26 DIAGNOSIS — F329 Major depressive disorder, single episode, unspecified: Secondary | ICD-10-CM | POA: Diagnosis not present

## 2016-01-26 DIAGNOSIS — F32A Depression, unspecified: Secondary | ICD-10-CM

## 2016-01-26 MED ORDER — ESCITALOPRAM OXALATE 5 MG PO TABS: 5.0000 mg | ORAL_TABLET | Freq: Every day | ORAL | Status: DC

## 2016-01-26 NOTE — Progress Notes (Signed)
Patient ID: Theresa Singleton, female   DOB: 10-Nov-1970, 45 y.o.   MRN: [id number] GYNECOLOGY  VISIT   HPI: 45 y.o.   Divorced  Caucasian  female   G1P1001 with Patient's last menstrual period was 11/10/2015 (approximate).   here for surgical consult.    On combined oral contraceptives.  Spotting almost daily since her hysteroscopy and laparoscopy.  Desires hysterectomy.  Declines future childbearing.   Had terrible hot flashes when took Lupron in the past.   Depression since beginning of year.  Broke with boyfriend.  Teary.  Tired.  Denies suicidal ideation or harming others.  Used Lexapro in past when separated from husband.  Took Lexaro 5 mg and did well.   GYNECOLOGIC HISTORY: Patient's last menstrual period was 11/10/2015 (approximate). Contraception:  Partner with vasectomy Menopausal hormone therapy:  n/a Last mammogram:  11/24/15, scattered areas of fibroglandular density, Bi-Rads 1: Negative; [facility] Last pap smear:   07/21/15, negative with neg HR HPV        OB History    Gravida Para Term Preterm AB TAB SAB Ectopic Multiple Living   1 1 1       1          There are no active problems to display for this patient.   Past Medical History  Diagnosis Date  . Amenorrhea   . Endometriosis   . Anxiety   . Infertility, female   . Diverticulitis   . Abnormal Pap smear of cervix 2016    CIN I  . Cervical stenosis (uterine cervix)     Past Surgical History  Procedure Laterality Date  . Oophorectomy Right 06/2003     Endometriosis   . Pelvic laparoscopy  2004    several. endometriosis   . Hysteroscopy  2003  . Endometrial ablation  2003  . Robotic assisted salpingo oopherectomy Left 12/09/2015    Procedure: LAPAROSCOPY WITH PELVIC WASHINGS & LYSIS OF ADHESIONS;  Surgeon: Nunzio Cobbs, MD;  Location: [facility];  Service: Gynecology;  Laterality: Left;  Dr. Quincy Simmonds called at 6:45am to tell team not to drape robot out. The team had already set the room up  and draped the robot. The case was posted for a robotic salpingo oophorectomy  . Hysteroscopy w/d&c N/A 12/09/2015    Procedure: DILATATION AND CURETTAGE /HYSTEROSCOPY;  Surgeon: Nunzio Cobbs, MD;  Location: [facility];  Service: Gynecology;  Laterality: N/A;    Current Outpatient Prescriptions  Medication Sig Dispense Refill  . glucosamine-chondroitin 500-400 MG tablet Take 1 tablet by mouth daily.    [name] ibuprofen (ADVIL,MOTRIN) 800 MG tablet Take 800 mg by mouth as needed for mild pain.     [name] levonorgestrel-ethinyl estradiol (SEASONALE,INTROVALE,JOLESSA) 0.15-0.03 MG tablet Take 1 tablet by mouth daily. 3 Package 0  . LORazepam (ATIVAN) 0.5 MG tablet Take 0.5 mg by mouth at bedtime as needed for sleep.     . Multiple Vitamin (MULTIVITAMIN) tablet Take 1 tablet by mouth daily.    . NON FORMULARY Take 1 tablet by mouth daily. Calm-aid    . oxyCODONE-acetaminophen (PERCOCET) 5-325 MG tablet Take 2 tablets by mouth every 4 (four) hours as needed. use only as much as needed to relieve pain 30 tablet 0  . escitalopram (LEXAPRO) 5 MG tablet Take 1 tablet (5 mg total) by mouth daily. 30 tablet 2   No current facility-administered medications for this visit.     ALLERGIES: Aspirin and Codeine  Family History  Problem  Relation Age of Onset  . Diabetes Maternal Uncle   . Diabetes Maternal Uncle     Social History   Social History  . Marital Status: Divorced    Spouse Name: N/A  . Number of Children: N/A  . Years of Education: N/A   Occupational History  . Not on file.   Social History Main Topics  . Smoking status: Never Smoker   . Smokeless tobacco: Never Used  . Alcohol Use: 1.2 oz/week    2 Glasses of wine per week  . Drug Use: No  . Sexual Activity:    Partners: Male    Birth Control/ Protection: Surgical     Comment: vasectomy - boyfriend    Other Topics Concern  . Not on file   Social History Narrative    ROS:  Pertinent items are noted in HPI.  PHYSICAL  EXAMINATION:    BP 110/72 mmHg  Pulse 70  Ht 5' 4.5" (1.638 m)  Wt 216 lb 12.8 oz (98.34 kg)  BMI 36.65 kg/m2  LMP 11/10/2015 (Approximate)    General appearance: alert, cooperative and appears stated age Head: Normocephalic, without obvious abnormality, atraumatic Neck: no adenopathy, supple, symmetrical, trachea midline and thyroid normal to inspection and palpation Lungs: clear to auscultation bilaterally Heart: regular rate and rhythm Abdomen: incision(s):Yes.  ,  laparoscopic and Pfannenstiel incisions. Abdomen -  soft, non-tender, no masses,  no organomegaly Extremities: extremities normal, atraumatic, no cyanosis or edema Skin: Skin color, texture, turgor normal. No rashes or lesions Lymph nodes: Cervical, supraclavicular, and axillary nodes normal. No abnormal inguinal nodes palpated Neurologic: Grossly normal  Pelvic: External genitalia:  no lesions              Urethra:  normal appearing urethra with no masses, tenderness or lesions              Bartholins and Skenes: normal                 Vagina: normal appearing vagina with normal color and discharge, no lesions              Cervix: no lesions         Bimanual Exam:  Uterus:  normal size, contour, position, consistency, mobility, non-tender              Adnexa: normal adnexa and no mass, fullness, tenderness            Chaperone was present for exam.  ASSESSMENT  Stage IV endometriosis by laparoscopy.  Status post multiple prior pelvic surgeries - laparoscopy and laparotomy.   Status post RSO. Irregular vaginal bleeding on combined OCPs.  Dysmenorrhea. Depression.  PLAN  Will proceed with TAH/LSO.  Risks, benefits, and alternatives reviewed with the patient who wishes to proceed.    Risks include but are not limited to bleeding, infection, damage to surrounding organs, reaction to anesthesia, pneumonia, DVT, PE, death, hernia formation, need for reoperation, surgical menopause. Patient understands that  removal of her remaining ovary will cause surgical menopause, which can be treated with estrogen therapy.  Surgical expectations and recovery discussed. Continue combined oral contraceptives. She will do a bowel prep with magnesium citrate preop. Will start Lexapro 5 mg daily. Gave information for counseling through [facility] group.  She will consider and call for appt if desired.   An After Visit Summary was printed and given to the patient.  __15____ minutes face to face time of which over 50% was spent in counseling.

## 2016-01-26 NOTE — Patient Instructions (Signed)
Escitalopram tablets What is this medicine? ESCITALOPRAM (es sye TAL oh pram) is used to treat depression and certain types of anxiety. This medicine may be used for other purposes; ask your health care provider or pharmacist if you have questions. What should I tell my health care provider before I take this medicine? They need to know if you have any of these conditions: -bipolar disorder or a family history of bipolar disorder -diabetes -glaucoma -heart disease -kidney or liver disease -receiving electroconvulsive therapy -seizures (convulsions) -suicidal thoughts, plans, or attempt by you or a family member -an unusual or allergic reaction to escitalopram, the related drug citalopram, other medicines, foods, dyes, or preservatives -pregnant or trying to become pregnant -breast-feeding How should I use this medicine? Take this medicine by mouth with a glass of water. Follow the directions on the prescription label. You can take it with or without food. If it upsets your stomach, take it with food. Take your medicine at regular intervals. Do not take it more often than directed. Do not stop taking this medicine suddenly except upon the advice of your doctor. Stopping this medicine too quickly may cause serious side effects or your condition may worsen. A special MedGuide will be given to you by the pharmacist with each prescription and refill. Be sure to read this information carefully each time. Talk to your pediatrician regarding the use of this medicine in children. Special care may be needed. Overdosage: If you think you have taken too much of this medicine contact a poison control center or emergency room at once. NOTE: This medicine is only for you. Do not share this medicine with others. What if I miss a dose? If you miss a dose, take it as soon as you can. If it is almost time for your next dose, take only that dose. Do not take double or extra doses. What may interact with this  medicine? Do not take this medicine with any of the following medications: -certain medicines for fungal infections like fluconazole, itraconazole, ketoconazole, posaconazole, voriconazole -cisapride -citalopram -dofetilide -dronedarone -linezolid -MAOIs like Carbex, Eldepryl, Marplan, Nardil, and Parnate -methylene blue (injected into a vein) -pimozide -thioridazine -ziprasidone This medicine may also interact with the following medications: -alcohol -aspirin and aspirin-like medicines -carbamazepine -certain medicines for depression, anxiety, or psychotic disturbances -certain medicines for migraine headache like almotriptan, eletriptan, frovatriptan, naratriptan, rizatriptan, sumatriptan, zolmitriptan -certain medicines for sleep -certain medicines that treat or prevent blood clots like warfarin, enoxaparin, dalteparin -cimetidine -diuretics -fentanyl -furazolidone -isoniazid -lithium -metoprolol -NSAIDs, medicines for pain and inflammation, like ibuprofen or naproxen -other medicines that prolong the QT interval (cause an abnormal heart rhythm) -procarbazine -rasagiline -supplements like St. John's wort, kava kava, valerian -tramadol -tryptophan This list may not describe all possible interactions. Give your health care provider a list of all the medicines, herbs, non-prescription drugs, or dietary supplements you use. Also tell them if you smoke, drink alcohol, or use illegal drugs. Some items may interact with your medicine. What should I watch for while using this medicine? Tell your doctor if your symptoms do not get better or if they get worse. Visit your doctor or health care professional for regular checks on your progress. Because it may take several weeks to see the full effects of this medicine, it is important to continue your treatment as prescribed by your doctor. Patients and their families should watch out for new or worsening thoughts of suicide or  depression. Also watch out for sudden changes in feelings such  or health care professional for regular checks on your progress. Because it may take several weeks to see the full effects of this medicine, it is important to continue your treatment as prescribed by your doctor.  Patients and their families should watch out for new or worsening thoughts of suicide or  depression. Also watch out for sudden changes in feelings such as feeling anxious, agitated, panicky, irritable, hostile, aggressive, impulsive, severely restless, overly excited and hyperactive, or not being able to sleep. If this happens, especially at the beginning of treatment or after a change in dose, call your health care professional.  You may get drowsy or dizzy. Do not drive, use machinery, or do anything that needs mental alertness until you know how this medicine affects you. Do not stand or sit up quickly, especially if you are an older patient. This reduces the risk of dizzy or fainting spells. Alcohol may interfere with the effect of this medicine. Avoid alcoholic drinks.  Your mouth may get dry. Chewing sugarless gum or sucking hard candy, and drinking plenty of water may help. Contact your doctor if the problem does not go away or is severe.  What side effects may I notice from receiving this medicine?  Side effects that you should report to your doctor or health care professional as soon as possible:  -allergic reactions like skin rash, itching or hives, swelling of the face, lips, or tongue  -confusion  -feeling faint or lightheaded, falls  -fast talking and excited feelings or actions that are out of control  -hallucination, loss of contact with reality  -seizures  -suicidal thoughts or other mood changes  -unusual bleeding or bruising  Side effects that usually do not require medical attention (report to your doctor or health care professional if they continue or are bothersome):  -blurred vision  -changes in appetite  -change in sex drive or performance  -headache  -increased sweating  -nausea  This list may not describe all possible side effects. Call your doctor for medical advice about side effects. You may report side effects to FDA at 1-800-FDA-1088.  Where should I keep my medicine?  Keep out of reach of children.  Store at room temperature between 15 and 30 degrees C (59 and 86 degrees  F). Throw away any unused medicine after the expiration date.  NOTE: This sheet is a summary. It may not cover all possible information. If you have questions about this medicine, talk to your doctor, pharmacist, or health care provider.     © 2016, [employer]. (2013-04-03 12:32:55)

## 2016-02-09 ENCOUNTER — Encounter (HOSPITAL_COMMUNITY): Payer: Self-pay

## 2016-02-09 ENCOUNTER — Encounter (HOSPITAL_COMMUNITY)
Admission: RE | Admit: 2016-02-09 | Discharge: 2016-02-09 | Disposition: A | Discharge status: Home / Self Care | Payer: 59 | Source: Ambulatory Visit | Attending: Obstetrics and Gynecology | Admitting: Obstetrics and Gynecology

## 2016-02-09 DIAGNOSIS — N939 Abnormal uterine and vaginal bleeding, unspecified: Secondary | ICD-10-CM | POA: Insufficient documentation

## 2016-02-09 DIAGNOSIS — N809 Endometriosis, unspecified: Secondary | ICD-10-CM | POA: Diagnosis not present

## 2016-02-09 DIAGNOSIS — N946 Dysmenorrhea, unspecified: Secondary | ICD-10-CM | POA: Insufficient documentation

## 2016-02-09 DIAGNOSIS — Z01812 Encounter for preprocedural laboratory examination: Secondary | ICD-10-CM | POA: Insufficient documentation

## 2016-02-09 HISTORY — DX: Anemia, unspecified: D64.9

## 2016-02-09 HISTORY — DX: Depression, unspecified: F32.A

## 2016-02-09 HISTORY — DX: Major depressive disorder, single episode, unspecified: F32.9

## 2016-02-09 HISTORY — DX: Other seasonal allergic rhinitis: J30.2

## 2016-02-09 LAB — COMPREHENSIVE METABOLIC PANEL
ALK PHOS: 49 U/L (ref 38–126)
ALT: 28 U/L (ref 14–54)
AST: 32 U/L (ref 15–41)
Albumin: 3.6 g/dL (ref 3.5–5.0)
Anion gap: 6 (ref 5–15)
BUN: 13 mg/dL (ref 6–20)
CALCIUM: 8.9 mg/dL (ref 8.9–10.3)
CHLORIDE: 108 mmol/L (ref 101–111)
CO2: 24 mmol/L (ref 22–32)
CREATININE: 0.84 mg/dL (ref 0.44–1.00)
Glucose, Bld: 87 mg/dL (ref 65–99)
Potassium: 4.5 mmol/L (ref 3.5–5.1)
Sodium: 138 mmol/L (ref 135–145)
Total Bilirubin: 0.3 mg/dL (ref 0.3–1.2)
Total Protein: 7.5 g/dL (ref 6.5–8.1)

## 2016-02-09 LAB — TYPE AND SCREEN
ABO/RH(D): O POS
Antibody Screen: NEGATIVE

## 2016-02-09 LAB — CBC
HEMATOCRIT: 43 % (ref 36.0–46.0)
HEMOGLOBIN: 15.3 g/dL — AB (ref 12.0–15.0)
MCH: 33.6 pg (ref 26.0–34.0)
MCHC: 35.6 g/dL (ref 30.0–36.0)
MCV: 94.5 fL (ref 78.0–100.0)
Platelets: 318 10*3/uL (ref 150–400)
RBC: 4.55 MIL/uL (ref 3.87–5.11)
RDW: 12.6 % (ref 11.5–15.5)
WBC: 6.8 10*3/uL (ref 4.0–10.5)

## 2016-02-09 LAB — ABO/RH: ABO/RH(D): O POS

## 2016-02-09 NOTE — Patient Instructions (Addendum)
Your procedure is scheduled on: Tuesday, May 30  Enter through the Main Entrance of [facility] at: 6:00am  Pick up the phone at the desk and dial [phone].  Call this number if you have problems the morning of surgery: [phone].  Remember: Do NOT eat or drink after midnight Monday, 5/29.  Take these medicines the morning of surgery with a SIP OF WATER:  Lexapro, Lorazepam if needed.  Do NOT wear jewelry (body piercing), metal hair clips/bobby pins, make-up, or nail polish. Do NOT wear lotions, powders, or perfumes.  You may wear deoderant. Do NOT shave for 48 hours prior to surgery. Do NOT bring valuables to the hospital.   Leave suitcase in car.  After surgery it may be brought to your room.  For patients admitted to the hospital, checkout time is 11:00 AM the day of discharge. Home with Son Virl Diamond cell [phone].

## 2016-02-16 MED ORDER — DEXTROSE 5 % IV SOLN
2.0000 g | INTRAVENOUS | Status: AC
  Administered 2016-02-17: 2 g via INTRAVENOUS
  Filled 2016-02-16: qty 2

## 2016-02-16 NOTE — H&P (Signed)
Progress Notes      Nunzio Cobbs, MD at 01/26/2016 2:48 PM     Status: Signed       Expand All Collapse All   Patient ID: Theresa Singleton, female DOB: November 13, 1970, 45 y.o. MRN: [id number] GYNECOLOGY VISIT  HPI: 45 y.o. Divorced Caucasian female  G1P1001 with Patient's last menstrual period was 11/10/2015 (approximate).  here for surgical consult.   On combined oral contraceptives.  Spotting almost daily since her hysteroscopy and laparoscopy.  Desires hysterectomy.  Declines future childbearing.   Had terrible hot flashes when took Lupron in the past.   Depression since beginning of year.  Broke with boyfriend.  Teary.  Tired.  Denies suicidal ideation or harming others.  Used Lexapro in past when separated from husband.  Took Lexaro 5 mg and did well.   GYNECOLOGIC HISTORY: Patient's last menstrual period was 11/10/2015 (approximate). Contraception: Partner with vasectomy Menopausal hormone therapy: n/a Last mammogram: 11/24/15, scattered areas of fibroglandular density, Bi-Rads 1: Negative; [facility] Last pap smear: 07/21/15, negative with neg HR HPV   OB History    Gravida Para Term Preterm AB TAB SAB Ectopic Multiple Living   1 1 1       1        There are no active problems to display for this patient.   Past Medical History  Diagnosis Date  . Amenorrhea   . Endometriosis   . Anxiety   . Infertility, female   . Diverticulitis   . Abnormal Pap smear of cervix 2016    CIN I  . Cervical stenosis (uterine cervix)     Past Surgical History  Procedure Laterality Date  . Oophorectomy Right 06/2003     Endometriosis   . Pelvic laparoscopy  2004    several. endometriosis   . Hysteroscopy  2003  . Endometrial ablation  2003   Laparoscopy with lysis of adhesions and collection of pelvic washings.  Surgeon:  Nunzio Cobbs, MD; Location:  [facility]; Service:  Gynecology  12/09/15      . Hysteroscopy w/d&c N/A 12/09/2015    Procedure: DILATATION AND CURETTAGE /HYSTEROSCOPY; Surgeon: Nunzio Cobbs, MD; Location: [facility]; Service: Gynecology; Laterality: N/A;    Current Outpatient Prescriptions  Medication Sig Dispense Refill  . glucosamine-chondroitin 500-400 MG tablet Take 1 tablet by mouth daily.    [name] ibuprofen (ADVIL,MOTRIN) 800 MG tablet Take 800 mg by mouth as needed for mild pain.     [name] levonorgestrel-ethinyl estradiol (SEASONALE,INTROVALE,JOLESSA) 0.15-0.03 MG tablet Take 1 tablet by mouth daily. 3 Package 0  . LORazepam (ATIVAN) 0.5 MG tablet Take 0.5 mg by mouth at bedtime as needed for sleep.     . Multiple Vitamin (MULTIVITAMIN) tablet Take 1 tablet by mouth daily.    . NON FORMULARY Take 1 tablet by mouth daily. Calm-aid    . oxyCODONE-acetaminophen (PERCOCET) 5-325 MG tablet Take 2 tablets by mouth every 4 (four) hours as needed. use only as much as needed to relieve pain 30 tablet 0  . escitalopram (LEXAPRO) 5 MG tablet Take 1 tablet (5 mg total) by mouth daily. 30 tablet 2   No current facility-administered medications for this visit.     ALLERGIES: Aspirin and Codeine  Family History  Problem Relation Age of Onset  . Diabetes Maternal Uncle   . Diabetes Maternal Uncle     Social History   Social History  . Marital Status: Divorced  Spouse Name: N/A  . Number of Children: N/A  . Years of Education: N/A   Occupational History  . Not on file.   Social History Main Topics  . Smoking status: Never Smoker   . Smokeless tobacco: Never Used  . Alcohol Use: 1.2 oz/week    2 Glasses of wine per week  . Drug Use: No  . Sexual Activity:    Partners: Male    Birth Control/ Protection: Surgical     Comment: vasectomy - boyfriend    Other  Topics Concern  . Not on file   Social History Narrative    ROS: Pertinent items are noted in HPI.  PHYSICAL EXAMINATION:   BP 110/72 mmHg  Pulse 70  Ht 5' 4.5" (1.638 m)  Wt 216 lb 12.8 oz (98.34 kg)  BMI 36.65 kg/m2  LMP 11/10/2015 (Approximate)  General appearance: alert, cooperative and appears stated age Head: Normocephalic, without obvious abnormality, atraumatic Neck: no adenopathy, supple, symmetrical, trachea midline and thyroid normal to inspection and palpation Lungs: clear to auscultation bilaterally Heart: regular rate and rhythm Abdomen: incision(s):Yes. , laparoscopic and Pfannenstiel incisions. Abdomen - soft, non-tender, no masses, no organomegaly Extremities: extremities normal, atraumatic, no cyanosis or edema Skin: Skin color, texture, turgor normal. No rashes or lesions Lymph nodes: Cervical, supraclavicular, and axillary nodes normal. No abnormal inguinal nodes palpated Neurologic: Grossly normal  Pelvic: External genitalia: no lesions  Urethra: normal appearing urethra with no masses, tenderness or lesions  Bartholins and Skenes: normal   Vagina: normal appearing vagina with normal color and discharge, no lesions  Cervix: no lesions   Bimanual Exam: Uterus: normal size, contour, position, consistency, mobility, non-tender  Adnexa: normal adnexa and no mass, fullness, tenderness    Chaperone was present for exam.  ASSESSMENT  Stage IV endometriosis by laparoscopy.  Status post multiple prior pelvic surgeries - laparoscopy and laparotomy.  Status post RSO. Irregular vaginal bleeding on combined OCPs.  Dysmenorrhea. Depression.  PLAN  Will proceed with TAH/LSO. Risks, benefits, and alternatives reviewed with the patient who wishes to proceed.  Risks include but are not limited to bleeding, infection, damage to surrounding organs, reaction to  anesthesia, pneumonia, DVT, PE, death, hernia formation, need for reoperation, surgical menopause. Patient understands that removal of her remaining ovary will cause surgical menopause, which can be treated with estrogen therapy.  Surgical expectations and recovery discussed. Continue combined oral contraceptives. She will do a bowel prep with magnesium citrate preop. Will start Lexapro 5 mg daily. Gave information for counseling through [facility] group. She will consider and call for appt if desired.  An After Visit Summary was printed and given to the patient.  __15____ minutes face to face time of which over 50% was spent in counseling.

## 2016-02-17 ENCOUNTER — Encounter (HOSPITAL_COMMUNITY)
Admission: RE | Disposition: A | Discharge status: Home / Self Care | Payer: Self-pay | Source: Ambulatory Visit | Attending: Obstetrics and Gynecology

## 2016-02-17 ENCOUNTER — Inpatient Hospital Stay (HOSPITAL_COMMUNITY): Payer: 59 | Admitting: Anesthesiology

## 2016-02-17 ENCOUNTER — Encounter (HOSPITAL_COMMUNITY): Payer: Self-pay

## 2016-02-17 ENCOUNTER — Inpatient Hospital Stay (HOSPITAL_COMMUNITY)
Admission: RE | Admit: 2016-02-17 | Discharge: 2016-02-19 | DRG: 743 | Disposition: A | Discharge status: Home / Self Care | Payer: 59 | Source: Ambulatory Visit | Attending: Obstetrics and Gynecology | Admitting: Obstetrics and Gynecology

## 2016-02-17 DIAGNOSIS — D259 Leiomyoma of uterus, unspecified: Secondary | ICD-10-CM | POA: Diagnosis present

## 2016-02-17 DIAGNOSIS — S0500XA Injury of conjunctiva and corneal abrasion without foreign body, unspecified eye, initial encounter: Secondary | ICD-10-CM | POA: Diagnosis not present

## 2016-02-17 DIAGNOSIS — Z87410 Personal history of cervical dysplasia: Secondary | ICD-10-CM | POA: Diagnosis not present

## 2016-02-17 DIAGNOSIS — Y92239 Unspecified place in hospital as the place of occurrence of the external cause: Secondary | ICD-10-CM | POA: Diagnosis not present

## 2016-02-17 DIAGNOSIS — N882 Stricture and stenosis of cervix uteri: Secondary | ICD-10-CM | POA: Diagnosis present

## 2016-02-17 DIAGNOSIS — N736 Female pelvic peritoneal adhesions (postinfective): Secondary | ICD-10-CM | POA: Diagnosis present

## 2016-02-17 DIAGNOSIS — X58XXXA Exposure to other specified factors, initial encounter: Secondary | ICD-10-CM | POA: Diagnosis not present

## 2016-02-17 DIAGNOSIS — N800 Endometriosis of uterus: Secondary | ICD-10-CM | POA: Diagnosis present

## 2016-02-17 DIAGNOSIS — N946 Dysmenorrhea, unspecified: Secondary | ICD-10-CM | POA: Diagnosis present

## 2016-02-17 DIAGNOSIS — Z90721 Acquired absence of ovaries, unilateral: Secondary | ICD-10-CM | POA: Diagnosis not present

## 2016-02-17 DIAGNOSIS — F329 Major depressive disorder, single episode, unspecified: Secondary | ICD-10-CM | POA: Diagnosis present

## 2016-02-17 DIAGNOSIS — F419 Anxiety disorder, unspecified: Secondary | ICD-10-CM | POA: Diagnosis present

## 2016-02-17 DIAGNOSIS — N83202 Unspecified ovarian cyst, left side: Secondary | ICD-10-CM

## 2016-02-17 DIAGNOSIS — Z9079 Acquired absence of other genital organ(s): Secondary | ICD-10-CM

## 2016-02-17 DIAGNOSIS — N979 Female infertility, unspecified: Secondary | ICD-10-CM | POA: Diagnosis present

## 2016-02-17 DIAGNOSIS — Z90710 Acquired absence of both cervix and uterus: Secondary | ICD-10-CM | POA: Diagnosis present

## 2016-02-17 DIAGNOSIS — N803 Endometriosis of pelvic peritoneum: Secondary | ICD-10-CM

## 2016-02-17 DIAGNOSIS — N920 Excessive and frequent menstruation with regular cycle: Secondary | ICD-10-CM

## 2016-02-17 HISTORY — PX: CYSTOSCOPY: SHX5120

## 2016-02-17 HISTORY — PX: ABDOMINAL HYSTERECTOMY: SHX81

## 2016-02-17 HISTORY — DX: Acquired absence of both cervix and uterus: Z90.710

## 2016-02-17 LAB — PREGNANCY, URINE: PREG TEST UR: NEGATIVE

## 2016-02-17 SURGERY — HYSTERECTOMY, ABDOMINAL
Anesthesia: General | Site: Vagina

## 2016-02-17 MED ORDER — HYDROMORPHONE HCL 1 MG/ML IJ SOLN
INTRAMUSCULAR | Status: AC
  Filled 2016-02-17: qty 1

## 2016-02-17 MED ORDER — LIDOCAINE HCL (CARDIAC) 20 MG/ML IV SOLN
INTRAVENOUS | Status: AC
  Filled 2016-02-17: qty 5

## 2016-02-17 MED ORDER — KETOROLAC TROMETHAMINE 0.5 % OP SOLN
1.0000 [drp] | Freq: Three times a day (TID) | OPHTHALMIC | Status: AC | PRN
  Administered 2016-02-17: 1 [drp] via OPHTHALMIC
  Filled 2016-02-17: qty 3

## 2016-02-17 MED ORDER — OXYCODONE-ACETAMINOPHEN 5-325 MG PO TABS
1.0000 | ORAL_TABLET | ORAL | Status: DC | PRN
  Administered 2016-02-18 – 2016-02-19 (×3): 1 via ORAL
  Filled 2016-02-17 (×3): qty 1

## 2016-02-17 MED ORDER — DIPHENHYDRAMINE HCL 50 MG/ML IJ SOLN
12.5000 mg | Freq: Four times a day (QID) | INTRAMUSCULAR | Status: DC | PRN
Start: 2016-02-17 — End: 2016-02-18

## 2016-02-17 MED ORDER — MIDAZOLAM HCL 2 MG/2ML IJ SOLN
INTRAMUSCULAR | Status: DC | PRN
  Administered 2016-02-17: 2 mg via INTRAVENOUS

## 2016-02-17 MED ORDER — PHENYLEPHRINE HCL 10 MG/ML IJ SOLN
INTRAMUSCULAR | Status: DC | PRN
  Administered 2016-02-17 (×2): 40 ug via INTRAVENOUS
  Administered 2016-02-17 (×2): 80 ug via INTRAVENOUS
  Administered 2016-02-17: 40 ug via INTRAVENOUS
  Administered 2016-02-17: 80 ug via INTRAVENOUS

## 2016-02-17 MED ORDER — KETOROLAC TROMETHAMINE 30 MG/ML IJ SOLN
INTRAMUSCULAR | Status: DC | PRN
  Administered 2016-02-17: 30 mg via INTRAVENOUS

## 2016-02-17 MED ORDER — KETOROLAC TROMETHAMINE 30 MG/ML IJ SOLN
30.0000 mg | Freq: Four times a day (QID) | INTRAMUSCULAR | Status: AC
  Administered 2016-02-17 – 2016-02-18 (×4): 30 mg via INTRAVENOUS
  Filled 2016-02-17 (×4): qty 1

## 2016-02-17 MED ORDER — POLYMYXIN B-TRIMETHOPRIM 10000-0.1 UNIT/ML-% OP SOLN
1.0000 [drp] | Freq: Three times a day (TID) | OPHTHALMIC | Status: AC
  Administered 2016-02-17 – 2016-02-18 (×2): 1 [drp] via OPHTHALMIC
  Filled 2016-02-17: qty 10

## 2016-02-17 MED ORDER — SODIUM CHLORIDE 0.9% FLUSH: 9.0000 mL | INTRAVENOUS | Status: DC | PRN

## 2016-02-17 MED ORDER — NALOXONE HCL 0.4 MG/ML IJ SOLN
0.4000 mg | INTRAMUSCULAR | Status: DC | PRN
Start: 2016-02-17 — End: 2016-02-18

## 2016-02-17 MED ORDER — NEOSTIGMINE METHYLSULFATE 10 MG/10ML IV SOLN
INTRAVENOUS | Status: DC | PRN
  Administered 2016-02-17: 3 mg via INTRAVENOUS

## 2016-02-17 MED ORDER — MIDAZOLAM HCL 2 MG/2ML IJ SOLN
INTRAMUSCULAR | Status: AC
  Filled 2016-02-17: qty 2

## 2016-02-17 MED ORDER — HYDROMORPHONE HCL 1 MG/ML IJ SOLN
INTRAMUSCULAR | Status: DC | PRN
  Administered 2016-02-17 (×2): 0.5 mg via INTRAVENOUS

## 2016-02-17 MED ORDER — LACTATED RINGERS IV SOLN
INTRAVENOUS | Status: DC
  Administered 2016-02-17 – 2016-02-18 (×2): via INTRAVENOUS

## 2016-02-17 MED ORDER — LIDOCAINE HCL (CARDIAC) 20 MG/ML IV SOLN
INTRAVENOUS | Status: DC | PRN
  Administered 2016-02-17: 80 mg via INTRAVENOUS

## 2016-02-17 MED ORDER — BSS IO SOLN
15.0000 mL | Freq: Once | INTRAOCULAR | Status: DC
  Filled 2016-02-17: qty 15

## 2016-02-17 MED ORDER — MENTHOL 3 MG MT LOZG: 1.0000 | LOZENGE | OROMUCOSAL | Status: DC | PRN

## 2016-02-17 MED ORDER — ONDANSETRON HCL 4 MG PO TABS: 4.0000 mg | ORAL_TABLET | Freq: Four times a day (QID) | ORAL | Status: DC | PRN

## 2016-02-17 MED ORDER — LACTATED RINGERS IV SOLN
INTRAVENOUS | Status: DC
  Administered 2016-02-17: 125 mL/h via INTRAVENOUS
  Administered 2016-02-17 (×4): via INTRAVENOUS

## 2016-02-17 MED ORDER — FENTANYL CITRATE (PF) 100 MCG/2ML IJ SOLN
INTRAMUSCULAR | Status: AC
  Filled 2016-02-17: qty 2

## 2016-02-17 MED ORDER — GLYCOPYRROLATE 0.2 MG/ML IJ SOLN
INTRAMUSCULAR | Status: DC | PRN
  Administered 2016-02-17: 0.6 mg via INTRAVENOUS

## 2016-02-17 MED ORDER — PHENYLEPHRINE 40 MCG/ML (10ML) SYRINGE FOR IV PUSH (FOR BLOOD PRESSURE SUPPORT)
PREFILLED_SYRINGE | INTRAVENOUS | Status: AC
  Filled 2016-02-17: qty 20

## 2016-02-17 MED ORDER — DIPHENHYDRAMINE HCL 12.5 MG/5ML PO ELIX: 12.5000 mg | ORAL_SOLUTION | Freq: Four times a day (QID) | ORAL | Status: DC | PRN

## 2016-02-17 MED ORDER — LACTATED RINGERS IV SOLN: INTRAVENOUS | Status: DC

## 2016-02-17 MED ORDER — SCOPOLAMINE 1 MG/3DAYS TD PT72
MEDICATED_PATCH | TRANSDERMAL | Status: AC
  Filled 2016-02-17: qty 1

## 2016-02-17 MED ORDER — ONDANSETRON HCL 4 MG/2ML IJ SOLN: 4.0000 mg | Freq: Four times a day (QID) | INTRAMUSCULAR | Status: DC | PRN

## 2016-02-17 MED ORDER — DEXAMETHASONE SODIUM PHOSPHATE 4 MG/ML IJ SOLN
INTRAMUSCULAR | Status: DC | PRN
  Administered 2016-02-17: 4 mg via INTRAVENOUS

## 2016-02-17 MED ORDER — PROPOFOL 10 MG/ML IV BOLUS
INTRAVENOUS | Status: DC | PRN
  Administered 2016-02-17: 150 mg via INTRAVENOUS

## 2016-02-17 MED ORDER — ROCURONIUM BROMIDE 100 MG/10ML IV SOLN
INTRAVENOUS | Status: AC
  Filled 2016-02-17: qty 1

## 2016-02-17 MED ORDER — HYDROMORPHONE HCL 1 MG/ML IJ SOLN: 0.2500 mg | INTRAMUSCULAR | Status: DC | PRN

## 2016-02-17 MED ORDER — SODIUM CHLORIDE 0.9 % IR SOLN
Status: DC | PRN
  Administered 2016-02-17: 3000 mL

## 2016-02-17 MED ORDER — MORPHINE SULFATE 2 MG/ML IV SOLN
INTRAVENOUS | Status: DC
  Administered 2016-02-17: 5.25 mg via INTRAVENOUS
  Administered 2016-02-17: 7.5 mg via INTRAVENOUS
  Administered 2016-02-17: 12:00:00 via INTRAVENOUS
  Administered 2016-02-18: 0.75 mL via INTRAVENOUS
  Administered 2016-02-18 (×2): 1.5 mg via INTRAVENOUS
  Filled 2016-02-17: qty 25

## 2016-02-17 MED ORDER — IBUPROFEN 600 MG PO TABS
600.0000 mg | ORAL_TABLET | Freq: Four times a day (QID) | ORAL | Status: DC | PRN
  Administered 2016-02-18 – 2016-02-19 (×2): 600 mg via ORAL
  Filled 2016-02-17 (×2): qty 1

## 2016-02-17 MED ORDER — ONDANSETRON HCL 4 MG/2ML IJ SOLN
INTRAMUSCULAR | Status: DC | PRN
  Administered 2016-02-17: 4 mg via INTRAVENOUS

## 2016-02-17 MED ORDER — ROCURONIUM BROMIDE 100 MG/10ML IV SOLN
INTRAVENOUS | Status: DC | PRN
  Administered 2016-02-17 (×2): 10 mg via INTRAVENOUS
  Administered 2016-02-17: 5 mg via INTRAVENOUS
  Administered 2016-02-17: 50 mg via INTRAVENOUS
  Administered 2016-02-17: 20 mg via INTRAVENOUS

## 2016-02-17 MED ORDER — SCOPOLAMINE 1 MG/3DAYS TD PT72
1.0000 | MEDICATED_PATCH | Freq: Once | TRANSDERMAL | Status: DC
  Administered 2016-02-17: 1.5 mg via TRANSDERMAL

## 2016-02-17 MED ORDER — KETOROLAC TROMETHAMINE 30 MG/ML IJ SOLN
INTRAMUSCULAR | Status: AC
  Filled 2016-02-17: qty 1

## 2016-02-17 MED ORDER — FENTANYL CITRATE (PF) 250 MCG/5ML IJ SOLN
INTRAMUSCULAR | Status: AC
  Filled 2016-02-17: qty 5

## 2016-02-17 MED ORDER — FENTANYL CITRATE (PF) 100 MCG/2ML IJ SOLN
INTRAMUSCULAR | Status: DC | PRN
  Administered 2016-02-17 (×7): 50 ug via INTRAVENOUS

## 2016-02-17 MED ORDER — DEXAMETHASONE SODIUM PHOSPHATE 4 MG/ML IJ SOLN
INTRAMUSCULAR | Status: AC
  Filled 2016-02-17: qty 1

## 2016-02-17 MED ORDER — PROPOFOL 10 MG/ML IV BOLUS
INTRAVENOUS | Status: AC
  Filled 2016-02-17: qty 20

## 2016-02-17 MED ORDER — NEOSTIGMINE METHYLSULFATE 10 MG/10ML IV SOLN
INTRAVENOUS | Status: AC
  Filled 2016-02-17: qty 1

## 2016-02-17 MED ORDER — STERILE WATER FOR IRRIGATION IR SOLN
Status: DC | PRN
  Administered 2016-02-17: 1000 mL

## 2016-02-17 MED ORDER — BUPIVACAINE HCL (PF) 0.25 % IJ SOLN
INTRAMUSCULAR | Status: AC
  Filled 2016-02-17: qty 30

## 2016-02-17 MED ORDER — ONDANSETRON HCL 4 MG/2ML IJ SOLN
INTRAMUSCULAR | Status: AC
  Filled 2016-02-17: qty 2

## 2016-02-17 MED ORDER — GLYCOPYRROLATE 0.2 MG/ML IJ SOLN
INTRAMUSCULAR | Status: AC
  Filled 2016-02-17: qty 3

## 2016-02-17 SURGICAL SUPPLY — 43 items
CANISTER SUCT 3000ML (MISCELLANEOUS) ×4 IMPLANT
CATH FOLEY 3WAY  5CC 16FR (CATHETERS)
CATH FOLEY 3WAY 5CC 16FR (CATHETERS) IMPLANT
CELLS DAT CNTRL 66122 CELL SVR (MISCELLANEOUS) IMPLANT
CLOTH BEACON ORANGE TIMEOUT ST (SAFETY) ×4 IMPLANT
DECANTER SPIKE VIAL GLASS SM (MISCELLANEOUS) ×2 IMPLANT
DRAPE WARM FLUID 44X44 (DRAPE) ×3 IMPLANT
DRSG OPSITE POSTOP 4X10 (GAUZE/BANDAGES/DRESSINGS) ×4 IMPLANT
DURAPREP 26ML APPLICATOR (WOUND CARE) ×4 IMPLANT
GAUZE SPONGE 4X4 16PLY XRAY LF (GAUZE/BANDAGES/DRESSINGS) ×2 IMPLANT
GLOVE BIO SURGEON STRL SZ 6.5 (GLOVE) ×3 IMPLANT
GLOVE BIO SURGEONS STRL SZ 6.5 (GLOVE) ×1
GLOVE BIOGEL PI IND STRL 7.0 (GLOVE) ×6 IMPLANT
GLOVE BIOGEL PI INDICATOR 7.0 (GLOVE) ×6
GOWN STRL REUS W/TWL LRG LVL3 (GOWN DISPOSABLE) ×12 IMPLANT
HEMOSTAT ARISTA ABSORB 3G PWDR (MISCELLANEOUS) ×2 IMPLANT
NEEDLE HYPO 22GX1.5 SAFETY (NEEDLE) IMPLANT
NS IRRIG 1000ML POUR BTL (IV SOLUTION) ×4 IMPLANT
PACK ABDOMINAL GYN (CUSTOM PROCEDURE TRAY) ×4 IMPLANT
PACK VAGINAL MINOR WOMEN LF (CUSTOM PROCEDURE TRAY) ×4 IMPLANT
PAD OB MATERNITY 4.3X12.25 (PERSONAL CARE ITEMS) ×4 IMPLANT
RETRACTOR WND ALEXIS 18 MED (MISCELLANEOUS) IMPLANT
RETRACTOR WND ALEXIS 25 LRG (MISCELLANEOUS) ×1 IMPLANT
RTRCTR WOUND ALEXIS 18CM MED (MISCELLANEOUS)
RTRCTR WOUND ALEXIS 25CM LRG (MISCELLANEOUS) ×4
SET CYSTO W/LG BORE CLAMP LF (SET/KITS/TRAYS/PACK) ×4 IMPLANT
SHEET LAVH (DRAPES) ×4 IMPLANT
SPONGE LAP 18X18 X RAY DECT (DISPOSABLE) ×8 IMPLANT
STAPLER VISISTAT 35W (STAPLE) ×4 IMPLANT
SUT PLAIN 2 0 XLH (SUTURE) IMPLANT
SUT VIC AB 0 CT1 18XCR BRD8 (SUTURE) ×4 IMPLANT
SUT VIC AB 0 CT1 27 (SUTURE) ×12
SUT VIC AB 0 CT1 27XBRD ANBCTR (SUTURE) ×6 IMPLANT
SUT VIC AB 0 CT1 8-18 (SUTURE) ×8
SUT VIC AB 2-0 CT1 27 (SUTURE) ×4
SUT VIC AB 2-0 CT1 TAPERPNT 27 (SUTURE) ×2 IMPLANT
SUT VIC AB 4-0 PS2 27 (SUTURE) IMPLANT
SUT VICRYL 0 TIES 12 18 (SUTURE) ×4 IMPLANT
SYR CONTROL 10ML LL (SYRINGE) IMPLANT
TOWEL OR 17X24 6PK STRL BLUE (TOWEL DISPOSABLE) ×8 IMPLANT
TRAY FOLEY BAG SILVER LF 16FR (SET/KITS/TRAYS/PACK) ×4 IMPLANT
TRAY FOLEY CATH SILVER 14FR (SET/KITS/TRAYS/PACK) ×2 IMPLANT
WATER STERILE IRR 1000ML POUR (IV SOLUTION) ×4 IMPLANT

## 2016-02-17 NOTE — Transfer of Care (Signed)
Immediate Anesthesia Transfer of Care Note  Patient: Theresa Singleton  Procedure(s) Performed: Procedure(s) with comments: HYSTERECTOMY TOTAL ABDOMINAL WITH LSO, EXTENSIVE LYSIS OF ADHESION AND POSSIBLE CYSTOSCOPY (N/A) - LEFT SALPINGO OOPHORECTOMY and lysis of adhesions  CYSTOSCOPY (N/A)  Patient Location: PACU  Anesthesia Type:General  Level of Consciousness: awake, alert , oriented and patient cooperative  Airway & Oxygen Therapy: Patient Spontanous Breathing and Patient connected to nasal cannula oxygen  Post-op Assessment: Report given to RN and Post -op Vital signs reviewed and stable  Post vital signs: Reviewed and stable  Last Vitals:  BP 122/70 HR 73 RR 16 POX 100 TEMP 98.1  Last Pain: 0  Patients Stated Pain Goal: 5 ([id number])  Complications: No apparent anesthesia complications

## 2016-02-17 NOTE — Progress Notes (Signed)
Day of Surgery Procedure(s) (LRB): HYSTERECTOMY TOTAL ABDOMINAL WITH LSO, EXTENSIVE LYSIS OF ADHESION AND POSSIBLE CYSTOSCOPY (N/A) CYSTOSCOPY (N/A)  Subjective: Patient reports left eye is sore.  Feels like there is something in it.  Was seen by anesthesia and told she may have a corneal abration.  Is receiving drops for this.  Tolerating clear liquids.  Not out of bed yet. Good pain control.   Objective: I have reviewed patient's vital signs and intake and output. T max 98.1, T now 98.1, BP 115/77, P 80, RR 16. I/O - 2765 cc/950 cc UO  General: alert, cooperative and closing left eye. Resp: clear to auscultation bilaterally Cardio: regular rate and rhythm, S1, S2 normal, no murmur, click, rub or gallop GI: soft, non-tender; bowel sounds normal; no masses,  no organomegaly and incision: dressing clean, dry, intact. Extremities: PAS and Ted hose on.  DPs 2+ bilaterally.  Vaginal Bleeding: none  Assessment: s/p Procedure(s) with comments: HYSTERECTOMY TOTAL ABDOMINAL WITH LSO, EXTENSIVE LYSIS OF ADHESION AND POSSIBLE CYSTOSCOPY (N/A) - LEFT SALPINGO OOPHORECTOMY and lysis of adhesions  CYSTOSCOPY (N/A): stable  Corneal abrasion.   Plan: Continue foley due to post op status Morphine PCA and Toradol for pain.   Clear liquids. Ambulate later today with nursing assistance.  CBC and BMP in am.  Trimethoprim Polymixin B and balanced salts drops.  Surgical findings and procedures discussed with patient.  Questions answered.   LOS: 0 days    Arloa Koh 02/17/2016, 4:25 PM

## 2016-02-17 NOTE — Progress Notes (Signed)
Called to post-op floor for patient complaint of left eye pain.  Patient is post-op TAH, LOA.  States her left eye is painful with pain is worse with eye movement, lid closing, and bright lights. Denies blurred vision.  Discussed that she is likely experiencing corneal abrasion which normally improves with time. Will order eye drops, warm compresses to left eye and will continue to follow for signs of improvement.  Discussed with patient and she agrees with plan.  Plan discussed with bedside nurse and orders reviewed prior to leaving inpatient unit.  Dr. Gifford Shave

## 2016-02-17 NOTE — Addendum Note (Signed)
Addendum  created 02/17/16 1401 by Catalina Gravel, MD   Modules edited: Orders, PRL Based Order Sets

## 2016-02-17 NOTE — Anesthesia Preprocedure Evaluation (Signed)
Anesthesia Evaluation  Patient identified by MRN, date of birth, ID band Patient awake    Reviewed: Allergy & Precautions, H&P , NPO status , Patient's Chart, lab work & pertinent test results  Airway Mallampati: I  TM Distance: >3 FB Neck ROM: Full    Dental no notable dental hx. (+) Dental Advisory Given, Teeth Intact   Pulmonary neg pulmonary ROS,    Pulmonary exam normal breath sounds clear to auscultation       Cardiovascular Exercise Tolerance: Good negative cardio ROS Normal cardiovascular exam Rhythm:Regular Rate:Normal     Neuro/Psych negative neurological ROS  negative psych ROS   GI/Hepatic negative GI ROS, Neg liver ROS,   Endo/Other  negative endocrine ROS  Renal/GU negative Renal ROS  negative genitourinary   Musculoskeletal   Abdominal   Peds  Hematology negative hematology ROS (+)   Anesthesia Other Findings   Reproductive/Obstetrics negative OB ROS                             Anesthesia Physical Anesthesia Plan  ASA: II  Anesthesia Plan: General   Post-op Pain Management:    Induction: Intravenous  Airway Management Planned: Oral ETT  Additional Equipment:   Intra-op Plan:   Post-operative Plan: Extubation in OR  Informed Consent: I have reviewed the patients History and Physical, chart, labs and discussed the procedure including the risks, benefits and alternatives for the proposed anesthesia with the patient or authorized representative who has indicated his/her understanding and acceptance.   Dental Advisory Given  Plan Discussed with: CRNA and Surgeon  Anesthesia Plan Comments:         Anesthesia Quick Evaluation

## 2016-02-17 NOTE — Brief Op Note (Signed)
02/17/2016  11:24 AM  PATIENT:  Theresa Singleton  45 y.o. female  PRE-OPERATIVE DIAGNOSIS:  Stage IV Endometriosis  POST-OPERATIVE DIAGNOSIS:  Stage IV endometriosis  PROCEDURE:  Procedure(s) with comments: HYSTERECTOMY TOTAL ABDOMINAL WITH LSO, EXTENSIVE LYSIS OF ADHESION AND POSSIBLE CYSTOSCOPY (N/A) - LEFT SALPINGO OOPHORECTOMY and lysis of adhesions  CYSTOSCOPY (N/A)  SURGEON:  Surgeon(s) and Role:    * Brook E Yisroel Ramming, MD - Primary    * Salvadore Dom, MD - Assisting  PHYSICIAN ASSISTANT: NA  ASSISTANTS: Dorothy Spark, MD   ANESTHESIA:   general  EBL:  Total I/O In: 2200 [I.V.:2200] Out: 725 [Urine:400; Blood:325]  BLOOD ADMINISTERED:none  DRAINS: Urinary Catheter (Foley)   LOCAL MEDICATIONS USED:  NONE  SPECIMEN:  Source of Specimen:  uterus, cervix. left tube and ovary.   DISPOSITION OF SPECIMEN:  PATHOLOGY  COUNTS:  YES  TOURNIQUET:  * No tourniquets in log *  DICTATION: .Other Dictation: Dictation Number    PLAN OF CARE: Admit to inpatient   PATIENT DISPOSITION:  PACU - hemodynamically stable.   Delay start of Pharmacological VTE agent (>24hrs) due to surgical blood loss or risk of bleeding: not applicable

## 2016-02-17 NOTE — Addendum Note (Signed)
Addendum  created 02/17/16 1407 by Catalina Gravel, MD   Modules edited: Clinical Notes   Clinical Notes:  File: [id number]; Pend: [id number]

## 2016-02-17 NOTE — Anesthesia Postprocedure Evaluation (Signed)
Anesthesia Post Note  Patient: Theresa Singleton  Procedure(s) Performed: Procedure(s) (LRB): HYSTERECTOMY TOTAL ABDOMINAL WITH LSO, EXTENSIVE LYSIS OF ADHESION AND POSSIBLE CYSTOSCOPY (N/A) CYSTOSCOPY (N/A)  Patient location during evaluation: PACU Anesthesia Type: General Level of consciousness: awake and alert Pain management: pain level controlled Vital Signs Assessment: post-procedure vital signs reviewed and stable Respiratory status: spontaneous breathing, nonlabored ventilation, respiratory function stable and patient connected to nasal cannula oxygen Cardiovascular status: blood pressure returned to baseline and stable Postop Assessment: no signs of nausea or vomiting Anesthetic complications: no     Last Vitals:  Filed Vitals:   02/17/16 1106 02/17/16 1115  BP:  116/79  Pulse: 85 74  Temp: 36.7 C   Resp: 16 12    Last Pain: There were no vitals filed for this visit. Pain Goal: Patients Stated Pain Goal: 5 (02/17/16 0631)               [name] L

## 2016-02-17 NOTE — Progress Notes (Signed)
Update to History and Physical  No marked change in status since office preop visit.  Patient examined.  Ok to proceed with surgery.  

## 2016-02-17 NOTE — Anesthesia Procedure Notes (Signed)
Procedure Name: Intubation Date/Time: 02/17/2016 7:27 AM Performed by: Brock Ra Pre-anesthesia Checklist: Patient identified, Emergency Drugs available, Suction available, Patient being monitored and Timeout performed Patient Re-evaluated:Patient Re-evaluated prior to inductionOxygen Delivery Method: Circle system utilized Preoxygenation: Pre-oxygenation with 100% oxygen Intubation Type: IV induction Ventilation: Mask ventilation without difficulty Laryngoscope Size: Mac and 3 Grade View: Grade I Tube type: Oral Tube size: 7.0 mm Number of attempts: 1 Airway Equipment and Method: Stylet Placement Confirmation: ETT inserted through vocal cords under direct vision,  positive ETCO2 and breath sounds checked- equal and bilateral Secured at: 20 (at teeth) cm Tube secured with: Tape Dental Injury: Teeth and Oropharynx as per pre-operative assessment

## 2016-02-18 ENCOUNTER — Encounter (HOSPITAL_COMMUNITY): Payer: Self-pay | Admitting: Obstetrics and Gynecology

## 2016-02-18 LAB — CBC
HCT: 31.9 % — ABNORMAL LOW (ref 36.0–46.0)
HEMOGLOBIN: 10.8 g/dL — AB (ref 12.0–15.0)
MCH: 32.3 pg (ref 26.0–34.0)
MCHC: 33.9 g/dL (ref 30.0–36.0)
MCV: 95.5 fL (ref 78.0–100.0)
Platelets: 273 10*3/uL (ref 150–400)
RBC: 3.34 MIL/uL — AB (ref 3.87–5.11)
RDW: 12.7 % (ref 11.5–15.5)
WBC: 13.2 10*3/uL — ABNORMAL HIGH (ref 4.0–10.5)

## 2016-02-18 LAB — BASIC METABOLIC PANEL
Anion gap: 4 — ABNORMAL LOW (ref 5–15)
BUN: 10 mg/dL (ref 6–20)
CHLORIDE: 106 mmol/L (ref 101–111)
CO2: 27 mmol/L (ref 22–32)
Calcium: 8 mg/dL — ABNORMAL LOW (ref 8.9–10.3)
Creatinine, Ser: 0.96 mg/dL (ref 0.44–1.00)
GFR calc non Af Amer: >60 mL/min (ref >60)
Glucose, Bld: 110 mg/dL — ABNORMAL HIGH (ref 65–99)
POTASSIUM: 4.3 mmol/L (ref 3.5–5.1)
SODIUM: 137 mmol/L (ref 135–145)

## 2016-02-18 MED ORDER — LORAZEPAM 1 MG PO TABS: 0.5000 mg | ORAL_TABLET | Freq: Every evening | ORAL | Status: DC | PRN

## 2016-02-18 MED ORDER — ESCITALOPRAM OXALATE 5 MG PO TABS
5.0000 mg | ORAL_TABLET | Freq: Every day | ORAL | Status: DC
  Administered 2016-02-18 – 2016-02-19 (×2): 5 mg via ORAL
  Filled 2016-02-18 (×2): qty 1

## 2016-02-18 NOTE — Op Note (Addendum)
Theresa Singleton, RAISANEN NO.:  [id number]  MEDICAL RECORD NO.:  [id number]  LOCATION:  9317                          FACILITY:  [facility]  PHYSICIAN:  Lenard Galloway, M.D.   DATE OF BIRTH:  [birth year]  DATE OF PROCEDURE:  02/17/2016 DATE OF DISCHARGE:                              OPERATIVE REPORT   PREOPERATIVE DIAGNOSIS:  Stage IV endometriosis.  POSTOPERATIVE DIAGNOSIS:  Stage IV endometriosis.  PROCEDURE:  Total abdominal hysterectomy with left salpingo- oophorectomy, extensive lysis of adhesions, cystoscopy.  SURGEON:  Josefa Half, M.D.  ASSISTANT:  Sumner Boast, M.D.  ANESTHESIA:  General endotracheal.  IV FLUIDS:  2200 mL Ringer's lactate.  ESTIMATED BLOOD LOSS:  325 mL.  URINE OUTPUT:  400 mL.  COMPLICATIONS:  None.  INDICATIONS FOR THE PROCEDURE:  The patient is a 45 year old gravida 1, para 10 Caucasian female who presents with pelvic pain and abnormal bleeding on continuous oral contraception.  The patient has a long- standing history of endometriosis and has had multiple prior laparoscopies and a laparotomy for a right salpingo-oophorectomy.  The patient underwent a laparoscopic procedure with lysis of adhesions and surgical exploration on December 09, 2015 at which time, a stage IV endometriosis was diagnosed.  She also had a hysteroscopy with dilation and curettage at that time.  A plan is now made to proceed with definitive treatment of the patient's stage IV endometriosis as she declines any future childbearing.  The plan is made to proceed with a total abdominal hysterectomy with left salpingo-oophorectomy, lysis of adhesions, and possible cystoscopy. Risks, benefits, and alternatives are reviewed with the patient who wishes to proceed.  FINDINGS:  Exam under anesthesia revealed a retroverted and fixed uterus.  No adnexal masses were appreciated.  At the time of laparotomy, obliteration of the cul-de-sac was appreciated.  The uterus was  drawn in a retroverted fashion towards the colon.  With dissection of the left adnexal region, it was clear that there was an endometrioma on that side, although it was not large.  Each of the ureters were drawn in toward the posterior and lateral aspects of the uterus and cervix.  The right side was more involved than the left side.  The right tube and ovary were absent.  The appendix was unremarkable.  In the upper abdomen, the liver, gallbladder, bilateral kidneys, and the periaortic region were unremarkable.  Cystoscopy at termination of the procedure documented the bladder to be intact throughout 360 degrees including the bladder dome and trigone. There was no evidence of any cystotomy or endometriosis lesions in the bladder.  The ureters were noted to be patent bilaterally.  SPECIMEN:  The uterus and cervix and the left tube and ovary were sent together to Pathology.  PROCEDURE IN DETAIL:  The patient was reidentified in the preoperative holding area.  She received cefotetan 2 g IV for antibiotic prophylaxis, and she received TED hose and PAS stockings for DVT prophylaxis.  In the operating room, the patient was placed in the dorsal lithotomy position with Allen stirrups, and general endotracheal anesthesia was induced.  The patient's abdomen and vagina were sterilely prepped, and she was sterilely draped.  A Foley catheter was placed inside  the bladder and left to gravity drainage throughout the procedure.  The procedure began by creating a Pfannenstiel incision with a scalpel along the patient's prior Pfannenstiel incision.  Dissection was carried through the subcutaneous layer using monopolar cautery for hemostasis. The fascia was then incised in the midline; and with a scalpel, the incision was extended bilaterally with Mayo scissors.  The rectus muscles were dissected off the fascia superiorly and inferiorly using sharp dissection and monopolar cautery for  hemostasis. The rectus muscles were divided in the midline bluntly.  The parietal peritoneum was elevated with two hemostat clamps and was entered sharply with the Metzenbaum scissors.  Peritoneal incision was extended cranially and caudally. The Alexis retractor was placed inside the peritoneal cavity, and the patient was then placed in the Trendelenburg position.  Moistened lap pads were used to pack the bowel into the upper abdomen.  Attention was turned to the pelvis at this time where a sharp dissection was used to draw the sigmoid colon away from the uterine fundus and the lower uterine segment.  The ureters were dissected out of the retroperitoneal spaces along the pelvic sidewalls bilaterally by opening the peritoneum in this region.  Babcock clamps were used to grasp the round ligaments bilaterally, and hemostats and a tenaculum was used to grasp the uterine fundus in order to provide elevation of the uterus out of the pelvis.  A sponge on a stick was also placed inside the vagina to assist with elevation of the uterus.  The adhesiolysis in the posterior cul-de-sac was a very tedious process which required at least 1-1/2 hours of surgical time.  The right round ligament at this time was suture ligated with a transfixing suture of 0 Vicryl.  The round ligament was then bisected with monopolar cautery.  The peritoneum was opened anteriorly and posteriorly.  The course of the ureter was continued to be dissected out of the pelvic sidewall.  The ureter was noted to pass directly lateral to the cervix on the patient's right-hand side right near the uterine artery pedicle.  The bladder flap was taken down anteriorly using sharp dissection with the Metzenbaum scissors.  Attention was turned to the patient's left-hand side.  The left round ligament was suture ligated with a transfixing suture of 0 Vicryl.  The round ligament was then transected using monopolar cautery.   The anterior and posterior leaves of the broad ligament were opened using sharp and blunt dissection.  The endometrioma was encountered as the lysis of adhesions occurred on the patient's left-hand side.  Eventually, the left infundibulopelvic ligament could be identified.  The clear space was identified below the infundibulopelvic ligament, and a window was created through the peritoneum.  The infundibulopelvic ligament was doubly clamped, sharply divided, was then suture ligated with a free tie of 0 Vicryl followed by a suture ligature of the same.  Again, careful dissection of the left ureter from the left pelvic sidewall was performed.  The bladder flap was taken down anteriorly.  At this time, the left uterine artery could be identified and was doubly clamped, sharply divided, and suture ligated with 0 Vicryl.  This was performed repeatedly on the patient's left-hand side until all branches of the uterine arteries and the cardinal ligament had been isolated, dissected, and then suture ligated with 0 Vicryl.  Attention was then turned back over to the patient's right-hand side, and dissection with a scalpel allowed the right ureter to fall away from the cervix.  The right uterine  artery could be isolated at this time. It was doubly clamped, sharply divided, and then suture ligated with 0 Vicryl.  The uterosacral ligaments at this time could be identified.  Each of them were clamped, sharply divided, and suture ligated with transfixing sutures of 0 Vicryl.  Heaney clamps were then placed across the vaginal apex bilaterally.  The pedicles were sharply divided and were suture ligated with transfixing sutures of 0 Vicryl.  The vagina had been entered at this time.  The cervix and uterus were circumscribed from the vaginal apex, and the specimen was sent to Pathology.  The vaginal cuff was closed with figure-of-eight sutures of 0 Vicryl.  The pelvis was irrigated and  suctioned at this time.  There were raw surfaces with the dissection occurred in the posterior cul-de-sac over the rectum.  Hemostasis was satisfactory at this time.  The Foley catheter and vaginal sponge stick were removed at this time.  The cystoscopy was performed.  The findings are as noted above.  A new Foley catheter was placed inside the bladder and was left to gravity drainage at this time.  The surgical field was re-examined at this time and there was some bleeding noted in the pelvis.  There was an area along the patient's left vaginal cuff which was bleeding and this responded to a figure-of- eight suture of 0 Vicryl.  There was also a small bleeding area in the right posterior cul-de-sac which responded to a free tie of 0 Vicryl.  A similar free tie of 2-0 Vicryl was placed along the bleeding area along the left pelvic sidewall away from the ureter.  The pelvis was irrigated at this time and hemostasis was now noted to be good.    Integrity of the rectum was checked due to the extensive lysis of adhesions in the posterior cul de sac. Saline fluid was placed in the posterior cul de sac, and a Toomy syringe filled with air was placed in the rectum.  Air was instilled twice in the rectum.  There was no evidence of air in the posterior cul de sac. The saline fluid was suctioned out.   Arista was placed in the posterior cul-de-sac.  Hemostasis was excellent and the abdomen was therefore closed.  The lap pads in the abdomen were removed, and the Alexis retractor was taken out as well.  The parietal peritoneum was closed with a running suture of 2-0 Vicryl. The rectus fascia was examined and was noted to be hemostatic.  The fascia was closed at this time with a running suture of 0 Vicryl.  The subcutaneous layer was irrigated and suctioned and noted to be hemostatic.  Interrupted sutures of 2-0 plain were placed in the subcutaneous layer.  The skin was closed with a subcuticular  closure of 4-0 Vicryl.  The incision was cleaned at this time with saline.  Steri-Strips and benzoin were placed across the incision, and a honeycomb dressing was placed over this.  This concluded the patient's procedure.  There were no complications. All needle, instrument, and sponge counts were correct.  The patient was escorted to the recovery room in stable and awake condition.     Lenard Galloway, M.D.     BES/MEDQ  D:  02/17/2016  T:  02/18/2016  Job:  [id number]

## 2016-02-18 NOTE — Addendum Note (Signed)
Addendum  created 02/18/16 0926 by Josephine Igo, MD   Modules edited: Clinical Notes   Clinical Notes:  File: [id number]

## 2016-02-18 NOTE — Addendum Note (Signed)
Addendum  created 02/18/16 1015 by Jonna Munro, CRNA   Modules edited: Clinical Notes   Clinical Notes:  File: [id number]

## 2016-02-18 NOTE — Anesthesia Postprocedure Evaluation (Signed)
Anesthesia Post Note  Patient: Theresa Singleton  Procedure(s) Performed: Procedure(s) (LRB): HYSTERECTOMY TOTAL ABDOMINAL WITH LSO, EXTENSIVE LYSIS OF ADHESION AND POSSIBLE CYSTOSCOPY (N/A) CYSTOSCOPY (N/A)  Patient location during evaluation: [facility] Anesthesia Type: General Level of consciousness: awake and alert and oriented Pain management: pain level controlled Vital Signs Assessment: post-procedure vital signs reviewed and stable Respiratory status: spontaneous breathing and nonlabored ventilation Cardiovascular status: blood pressure returned to baseline Postop Assessment: no signs of nausea or vomiting and adequate PO intake Anesthetic complications: no     Last Vitals:  Filed Vitals:   02/18/16 0512 02/18/16 1010  BP: 96/57 101/47  Pulse: 72 64  Temp: 36.9 C 36.7 C  Resp: 23 20    Last Pain:  Filed Vitals:   02/18/16 1011  PainSc: 3    Pain Goal: Patients Stated Pain Goal: 5 (02/18/16 0859)               Willa Rough

## 2016-02-18 NOTE — Progress Notes (Signed)
1 Day Post-Op Procedure(s) (LRB): HYSTERECTOMY TOTAL ABDOMINAL WITH LSO, EXTENSIVE LYSIS OF ADHESION AND POSSIBLE CYSTOSCOPY (N/A) CYSTOSCOPY (N/A)  Subjective: Patient reports good pain control.  Left eye is still bothersome.  Foley out.  No void yet.  Denies flatus.  Ambulated twice last hs.   Objective: I have reviewed patient's vital signs, intake and output and labs. T max 98.9, T now 98.4, BP 96/57, P 72, RR 23. I/O - 5703/1675. WBC - 13.2, Hgb 10.8.  Cr - 0.96  General: alert and cooperative Resp: clear to auscultation bilaterally Cardio: regular rate and rhythm, S1, S2 normal, no murmur, click, rub or gallop GI: soft, non-tender; bowel sounds normal; no masses,  no organomegaly and incision: dressing clean, dry, and intact. Vaginal Bleeding: none  Assessment: s/p Procedure(s) with comments: HYSTERECTOMY TOTAL ABDOMINAL WITH LSO, EXTENSIVE LYSIS OF ADHESION AND POSSIBLE CYSTOSCOPY (N/A) - LEFT SALPINGO OOPHORECTOMY and lysis of adhesions  CYSTOSCOPY (N/A): progressing well  Plan: Advance diet Encourage ambulation Advance to PO medication  LOS: 1 day    [employer] 02/18/2016, 7:58 AM

## 2016-02-18 NOTE — Progress Notes (Signed)
F/U on patient with corneal abrasion yesterday. She states her discomfort in her left eye is better than it was when she first awakened. She still has a mild foreign body sensation. Plan: Continue eye gtts. Patient reassured.

## 2016-02-19 MED ORDER — OXYCODONE-ACETAMINOPHEN 5-325 MG PO TABS: 1.0000 | ORAL_TABLET | ORAL | Status: DC | PRN

## 2016-02-19 MED ORDER — IBUPROFEN 800 MG PO TABS: 800.0000 mg | ORAL_TABLET | Freq: Three times a day (TID) | ORAL | Status: DC | PRN

## 2016-02-19 MED ORDER — BSS IO SOLN: 15.0000 mL | Freq: Once | INTRAOCULAR | Status: DC

## 2016-02-19 NOTE — Discharge Instructions (Signed)
Abdominal Hysterectomy, Care After °Refer to this sheet in the next few weeks. These instructions provide you with information on caring for yourself after your procedure. Your health care provider may also give you more specific instructions. Your treatment has been planned according to current medical practices, but problems sometimes occur. Call your health care provider if you have any problems or questions after your procedure.  °WHAT TO EXPECT AFTER THE PROCEDURE °After your procedure, it is typical to have the following: °· Pain. °· Feeling tired. °· Poor appetite. °· Less interest in sex. °It takes 4-6 weeks to recover from this surgery.  °HOME CARE INSTRUCTIONS  °· Take pain medicines only as directed by your health care provider. Do not take over-the-counter pain medicines without checking with your health care provider first.  °· Change your bandage as directed by your health care provider. °· Return to your health care provider to have your sutures taken out. °· Take showers instead of baths for 2-3 weeks. Ask your health care provider when it is safe to start showering.  °· Do not douche, use tampons, or have sexual intercourse for at least 6 weeks or until your health care provider says you can.   °· Follow your health care provider's advice about exercise, lifting, driving, and general activities. °· Get plenty of rest and sleep.   °· Do not lift anything heavier than a gallon of milk (about 10 lb [4.5 kg]) for the first month after surgery. °· You can resume your normal diet if your health care provider says it is okay.   °· Do not drink alcohol until your health care provider says you can.   °· If you are constipated, ask your health care provider if you can take a mild laxative. °· Eating foods high in fiber may also help with constipation. Eat plenty of raw fruits and vegetables, whole grains, and beans. °· Drink enough fluids to keep your urine clear or pale yellow.   °· Try to have someone at  home with you for the first 1-2 weeks to help around the house. °· Keep all follow-up appointments. °SEEK MEDICAL CARE IF:  °· You have chills or fever. °· You have swelling, redness, or pain in the area of your incision that is getting worse.   °· You have pus coming from the incision.   °· You notice a bad smell coming from the incision or bandage.   °· Your incision breaks open.   °· You feel dizzy or light-headed.   °· You have pain or bleeding when you urinate.   °· You have persistent diarrhea.   °· You have persistent nausea and vomiting.   °· You have abnormal vaginal discharge.   °· You have a rash.   °· You have any type of abnormal reaction or develop an allergy to your medicine.   °· Your pain medicine is not helping.   °SEEK IMMEDIATE MEDICAL CARE IF:  °· You have a fever and your symptoms suddenly get worse. °· You have severe abdominal pain. °· You have chest pain. °· You have shortness of breath. °· You faint. °· You have pain, swelling, or redness of your leg. °· You have heavy vaginal bleeding with blood clots. °MAKE SURE YOU: °· Understand these instructions. °· Will watch your condition. °· Will get help right away if you are not doing well or get worse. °  °This information is not intended to replace advice given to you by your health care provider. Make sure you discuss any questions you have with your health care provider. °  °Document   Released: 03/26/2005 Document Revised: 09/27/2014 Document Reviewed: 06/29/2013 °Elsevier Interactive Patient Education ©2016 [employer] ° °

## 2016-02-19 NOTE — Progress Notes (Signed)
ptout in wheelchair teaching complete

## 2016-02-19 NOTE — Progress Notes (Signed)
2 Days Post-Op Procedure(s) (LRB): HYSTERECTOMY TOTAL ABDOMINAL WITH LSO, EXTENSIVE LYSIS OF ADHESION AND POSSIBLE CYSTOSCOPY (N/A) CYSTOSCOPY (N/A)  Subjective: Patient reports tolerating PO, + flatus and no problems voiding.   Good pain control with Percocet and Motrin.  Ready for discharge.   Objective: I have reviewed patient's vital signs and intake and output. T max 98.4 - T now 98.2, BP 97/53, P 80, RR 18. I/O - 1315/1050  Pathology report - adenomyosis, endometriosis, leiomyomata.  General: alert and cooperative Resp: clear to auscultation bilaterally Cardio: regular rate and rhythm, S1, S2 normal, no murmur, click, rub or gallop GI: soft, non-tender; bowel sounds normal; no masses,  no organomegaly and incision: clean, dry and intact Vaginal Bleeding: none  Assessment: s/p Procedure(s) with comments: HYSTERECTOMY TOTAL ABDOMINAL WITH LSO, EXTENSIVE LYSIS OF ADHESION AND POSSIBLE CYSTOSCOPY (N/A) - LEFT SALPINGO OOPHORECTOMY and lysis of adhesions  CYSTOSCOPY (N/A): stable and progressing well  Plan: Discharge home Discharge instructions reviewed in verbal and written form.   Percocet and Motrin for pain.  MVI with iron daily.  Pathology report discussed.  Will initiate ERT as outpatient.  Follow up in 5 days.   LOS: 2 days    Arloa Koh 02/19/2016, 7:39 AM

## 2016-02-21 NOTE — Discharge Summary (Signed)
Physician Discharge Summary  Patient ID: Theresa Singleton MRN: [id number] DOB/AGE: 1970-12-23 45 y.o.  Admit date: 02/17/2016 Discharge date:  02/19/16  Admission Diagnoses:   Stage IV endometriosis.  Discharge Diagnoses:  1.  Stage IV endometriosis. 2.  Status post total abdominal hysterectomy with left salpingo-oophorectomy, extensive lysis of adhesions, and cystoscopy 02/19/16. 3.  Final pathology - endometriosis, adenomyosis, benign leiomyoma.  Active Problems:   Status post total abdominal hysterectomy   Discharged Condition: good  Hospital Course: The patient was admitted on 02/17/16 for a total abdominal hysterectomy with left salpingo-oophorectomy, extensive lysis of adhesions, and cystoscopy which were performed without complication while under general anesthesia.  The patient's post op course was uneventful.  She had a morphine PCA and Toradol for pain control initially, and this was converted over to Percocet and Motrin on post op day one when the patient began taking po well.  She ambulated independently and wore PAS and Ted hose for DVT prophylaxis while in bed.  Her foley catheter were removed on post op day one, and she voided good volumes. The patient's vital signs remained stable and she demonstrated no signs of infection during her hospitalization.  The patient's post op day one Hgb was 10.8.   She was tolerating the this well.  She had very minimal vaginal bleeding, and her incision(s) demonstrated no signs of erythema or significant drainage.  She was found to be in good condition and ready for discharge on post op day two. Her final pathology returned prior to discharge showing endometriosis, adenomyosis, and benign leiomyoma.  Consults: None  Significant Diagnostic Studies: labs:  See hospital course.  Treatments: surgery:  Total abdominal hysterectomy with left salpingo-oophorectomy, extensive lysis of adhesions, and cystoscopy - 02/17/16.  Discharge Exam: Blood  pressure 97/53, pulse 80, temperature 98.2 F (36.8 C), temperature source Oral, resp. rate 18, height 5' 4.5" (1.638 m), weight 215 lb (97.523 kg), SpO2 96 %. General: alert and cooperative Resp: clear to auscultation bilaterally Cardio: regular rate and rhythm, S1, S2 normal, no murmur, click, rub or gallop GI: soft, non-tender; bowel sounds normal; no masses, no organomegaly and incision: clean, dry and intact Vaginal Bleeding: none  Disposition: 01-Home or Self Care Instructions were reviewed in verbal and written form.    Medication List    STOP taking these medications        levonorgestrel-ethinyl estradiol 0.15-0.03 MG tablet  Commonly known as:  SEASONALE,INTROVALE,JOLESSA      TAKE these medications        ATIVAN 0.5 MG tablet  Generic drug:  LORazepam  Take 0.5 mg by mouth at bedtime as needed for sleep.     balanced salts Soln  Inject 15 mLs into the eye once.     escitalopram 5 MG tablet  Commonly known as:  LEXAPRO  Take 1 tablet (5 mg total) by mouth daily.     glucosamine-chondroitin 500-400 MG tablet  Take 1 tablet by mouth daily.     ibuprofen 800 MG tablet  Commonly known as:  ADVIL,MOTRIN  Take 1 tablet (800 mg total) by mouth every 8 (eight) hours as needed for mild pain.     multivitamin tablet  Take 1 tablet by mouth daily.     NON FORMULARY  Take 1 tablet by mouth daily. Calm-aid     oxyCODONE-acetaminophen 5-325 MG tablet  Commonly known as:  PERCOCET/ROXICET  Take 1-2 tablets by mouth every 4 (four) hours as needed for severe pain (moderate to severe pain (  when tolerating fluids)).           Follow-up Information    Follow up with Arloa Koh, MD In 5 days.   Specialty:  Obstetrics and Gynecology   Contact information:   [address] [place] [address] [phone]       Signed: Arloa Koh 02/21/2016, 11:41 AM

## 2016-02-23 ENCOUNTER — Encounter: Payer: Self-pay | Admitting: Obstetrics and Gynecology

## 2016-02-23 ENCOUNTER — Ambulatory Visit (INDEPENDENT_AMBULATORY_CARE_PROVIDER_SITE_OTHER): Payer: 59 | Admitting: Obstetrics and Gynecology

## 2016-02-23 VITALS — BP 110/68 | HR 84 | Ht 64.5 in | Wt 215.6 lb

## 2016-02-23 DIAGNOSIS — Z9889 Other specified postprocedural states: Secondary | ICD-10-CM

## 2016-02-23 DIAGNOSIS — D649 Anemia, unspecified: Secondary | ICD-10-CM

## 2016-02-23 LAB — CBC
HCT: 36.1 % (ref 35.0–45.0)
HEMOGLOBIN: 11.9 g/dL (ref 11.7–15.5)
MCH: 31.7 pg (ref 27.0–33.0)
MCHC: 33 g/dL (ref 32.0–36.0)
MCV: 96.3 fL (ref 80.0–100.0)
MPV: 8.4 fL (ref 7.5–12.5)
Platelets: 366 10*3/uL (ref 140–400)
RBC: 3.75 MIL/uL — AB (ref 3.80–5.10)
RDW: 12.6 % (ref 11.0–15.0)
WBC: 8 10*3/uL (ref 3.8–10.8)

## 2016-02-23 MED ORDER — ESCITALOPRAM OXALATE 5 MG PO TABS: 5.0000 mg | ORAL_TABLET | Freq: Every day | ORAL | Status: DC

## 2016-02-23 NOTE — Progress Notes (Signed)
Patient ID: Theresa Singleton, female   DOB: 04-07-1971, 45 y.o.   MRN: [id number] GYNECOLOGY  VISIT   HPI: 45 y.o.   Divorced  Caucasian  female   G1P1001 with Patient's last menstrual period was 02/03/2016 (exact date).   here for 1 week follow up HYSTERECTOMY TOTAL ABDOMINAL WITH LSO, EXTENSIVE LYSIS OF ADHESION AND POSSIBLE CYSTOSCOPY (N/A Abdomen)  CYSTOSCOPY (N/A Vagina ).   Taking Percocet at hs and Ibuprofen during the day.  Vaginal spotting.  Normal bladder and bowel function.   Taking a MVI.  Does feel tired.   Just starting to feel warm.   ON Lexapro 5 mg daily.  Doing Ok with this.   GYNECOLOGIC HISTORY: Patient's last menstrual period was 02/03/2016 (exact date). Contraception:  Hysterectomy Menopausal hormone therapy:  n/a Last mammogram: 11/24/15, scattered areas of fibroglandular density, Bi-Rads 1: Negative; [facility]  Last pap smear:   07/21/15, negative with neg HR HPV        OB History    Gravida Para Term Preterm AB TAB SAB Ectopic Multiple Living   1 1 1       1          Patient Active Problem List   Diagnosis Date Noted  . Status post total abdominal hysterectomy 02/17/2016    Past Medical History  Diagnosis Date  . Amenorrhea   . Endometriosis   . Anxiety   . Infertility, female   . Diverticulitis   . Abnormal Pap smear of cervix 2016    CIN I  . Cervical stenosis (uterine cervix)   . SVD (spontaneous vaginal delivery)     x 1  . Seasonal allergies   . Depression   . Anemia     Hx    Past Surgical History  Procedure Laterality Date  . Oophorectomy Right 06/2003     Endometriosis   . Pelvic laparoscopy  2004    several. endometriosis   . Hysteroscopy  2003  . Endometrial ablation  2003  . Robotic assisted salpingo oopherectomy Left 12/09/2015    Procedure: LAPAROSCOPY WITH PELVIC WASHINGS & LYSIS OF ADHESIONS;  Surgeon: Nunzio Cobbs, MD;  Location: [facility];  Service: Gynecology;  Laterality: Left;  Dr. Quincy Simmonds called at  6:45am to tell team not to drape robot out. The team had already set the room up and draped the robot. The case was posted for a robotic salpingo oophorectomy  . Hysteroscopy w/d&c N/A 12/09/2015    Procedure: DILATATION AND CURETTAGE /HYSTEROSCOPY;  Surgeon: Nunzio Cobbs, MD;  Location: [facility];  Service: Gynecology;  Laterality: N/A;  . Colonoscopy    . Abdominal hysterectomy N/A 02/17/2016    Procedure: HYSTERECTOMY TOTAL ABDOMINAL WITH LSO, EXTENSIVE LYSIS OF ADHESION AND POSSIBLE CYSTOSCOPY;  Surgeon: Nunzio Cobbs, MD;  Location: [facility];  Service: Gynecology;  Laterality: N/A;  LEFT SALPINGO OOPHORECTOMY and lysis of adhesions   . Cystoscopy N/A 02/17/2016    Procedure: CYSTOSCOPY;  Surgeon: Nunzio Cobbs, MD;  Location: [facility];  Service: Gynecology;  Laterality: N/A;    Current Outpatient Prescriptions  Medication Sig Dispense Refill  . balanced salts (BSS) SOLN Inject 15 mLs into the eye once. 250 mL 0  . escitalopram (LEXAPRO) 5 MG tablet Take 1 tablet (5 mg total) by mouth daily. 30 tablet 2  . glucosamine-chondroitin 500-400 MG tablet Take 1 tablet by mouth daily.    [name] ibuprofen (ADVIL,MOTRIN) 800 MG tablet  Take 1 tablet (800 mg total) by mouth every 8 (eight) hours as needed for mild pain. 30 tablet 1  . LORazepam (ATIVAN) 0.5 MG tablet Take 0.5 mg by mouth at bedtime as needed for sleep.     . Multiple Vitamin (MULTIVITAMIN) tablet Take 1 tablet by mouth daily.    . NON FORMULARY Take 1 tablet by mouth daily. Calm-aid    . oxyCODONE-acetaminophen (PERCOCET/ROXICET) 5-325 MG tablet Take 1-2 tablets by mouth every 4 (four) hours as needed for severe pain (moderate to severe pain (when tolerating fluids)). 30 tablet 0   No current facility-administered medications for this visit.     ALLERGIES: Aspirin and Codeine  Family History  Problem Relation Age of Onset  . Diabetes Maternal Uncle   . Diabetes Maternal Uncle     Social History   Social  History  . Marital Status: Divorced    Spouse Name: N/A  . Number of Children: N/A  . Years of Education: N/A   Occupational History  . Not on file.   Social History Main Topics  . Smoking status: Never Smoker   . Smokeless tobacco: Never Used  . Alcohol Use: 1.8 oz/week    3 Glasses of wine per week  . Drug Use: No  . Sexual Activity:    Partners: Male    Birth Control/ Protection: Surgical     Comment: TAH/LSO   Other Topics Concern  . Not on file   Social History Narrative    ROS:  Pertinent items are noted in HPI.  PHYSICAL EXAMINATION:    BP 110/68 mmHg  Pulse 84  Ht 5' 4.5" (1.638 m)  Wt 215 lb 9.6 oz (97.796 kg)  BMI 36.45 kg/m2  LMP 02/03/2016 (Exact Date)    General appearance: alert, cooperative and appears stated age   Abdomen: incision(s):Yes.  , ___Pfannenstiel incision with steristrips._________  soft, non-tender, no masses,  no organomegaly  ASSESSMENT  Status post TAH/LSO/LOA/cystoscopy.  Stage IV endometriosis.  Doing well post op. Surgical menopause. Depression.  Doing well on Lexapro 5 mg daily.   PLAN  Continue decreased activity.  Continue Lexapro 5 mg daily.  Return for next post op in 5 weeks.  Will consider ERT then.  In the meantime, OK for black cohosh and soy.     An After Visit Summary was printed and given to the patient.

## 2016-03-29 ENCOUNTER — Ambulatory Visit (INDEPENDENT_AMBULATORY_CARE_PROVIDER_SITE_OTHER): Payer: 59 | Admitting: Obstetrics and Gynecology

## 2016-03-29 ENCOUNTER — Encounter: Payer: Self-pay | Admitting: Obstetrics and Gynecology

## 2016-03-29 VITALS — BP 102/60 | HR 60 | Ht 64.5 in | Wt 214.0 lb

## 2016-03-29 DIAGNOSIS — Z9889 Other specified postprocedural states: Secondary | ICD-10-CM | POA: Diagnosis not present

## 2016-03-29 DIAGNOSIS — R3989 Other symptoms and signs involving the genitourinary system: Secondary | ICD-10-CM

## 2016-03-29 DIAGNOSIS — N3289 Other specified disorders of bladder: Secondary | ICD-10-CM

## 2016-03-29 DIAGNOSIS — E8940 Asymptomatic postprocedural ovarian failure: Secondary | ICD-10-CM

## 2016-03-29 DIAGNOSIS — N958 Other specified menopausal and perimenopausal disorders: Secondary | ICD-10-CM

## 2016-03-29 LAB — POCT URINALYSIS DIPSTICK
Bilirubin, UA: NEGATIVE
Blood, UA: NEGATIVE
Glucose, UA: NEGATIVE
Ketones, UA: NEGATIVE
LEUKOCYTES UA: NEGATIVE
NITRITE UA: NEGATIVE
PROTEIN UA: NEGATIVE
UROBILINOGEN UA: NEGATIVE
pH, UA: 5

## 2016-03-29 MED ORDER — ESTRADIOL 1 MG PO TABS: 1.0000 mg | ORAL_TABLET | Freq: Every day | ORAL | Status: DC

## 2016-03-29 NOTE — Patient Instructions (Signed)
Estradiol tablets What is this medicine? ESTRADIOL (es tra DYE ole) is an estrogen. It is mostly used as hormone replacement in menopausal women. It helps to treat hot flashes and prevent osteoporosis. It is also used to treat women with low estrogen levels or those who have had their ovaries removed. This medicine may be used for other purposes; ask your health care provider or pharmacist if you have questions. What should I tell my health care provider before I take this medicine? They need to know if you have or ever had any of these conditions: -abnormal vaginal bleeding -blood vessel disease or blood clots -breast, cervical, endometrial, ovarian, liver, or uterine cancer -dementia -diabetes -gallbladder disease -heart disease or recent heart attack -high blood pressure -high cholesterol -high level of calcium in the blood -hysterectomy -kidney disease -liver disease -migraine headaches -protein C deficiency -protein S deficiency -stroke -systemic lupus erythematosus (SLE) -tobacco smoker -an unusual or allergic reaction to estrogens, other hormones, medicines, foods, dyes, or preservatives -pregnant or trying to get pregnant -breast-feeding How should I use this medicine? Take this medicine by mouth. To reduce nausea, this medicine may be taken with food. Follow the directions on the prescription label. Take this medicine at the same time each day and in the order directed on the package. Do not take your medicine more often than directed. Contact your pediatrician regarding the use of this medicine in children. Special care may be needed. A patient package insert for the product will be given with each prescription and refill. Read this sheet carefully each time. The sheet may change frequently. Overdosage: If you think you have taken too much of this medicine contact a poison control center or emergency room at once. NOTE: This medicine is only for you. Do not share this  medicine with others. What if I miss a dose? If you miss a dose, take it as soon as you can. If it is almost time for your next dose, take only that dose. Do not take double or extra doses. What may interact with this medicine? Do not take this medicine with any of the following medications: -aromatase inhibitors like aminoglutethimide, anastrozole, exemestane, letrozole, testolactone This medicine may also interact with the following medications: -carbamazepine -certain antibiotics used to treat infections -certain barbiturates or benzodiazepines used for inducing sleep or treating seizures -grapefruit juice -medicines for fungus infections like itraconazole and ketoconazole -raloxifene or tamoxifen -rifabutin, rifampin, or rifapentine -ritonavir -St. John's Wort -warfarin This list may not describe all possible interactions. Give your health care provider a list of all the medicines, herbs, non-prescription drugs, or dietary supplements you use. Also tell them if you smoke, drink alcohol, or use illegal drugs. Some items may interact with your medicine. What should I watch for while using this medicine? Visit your doctor or health care professional for regular checks on your progress. You will need a regular breast and pelvic exam and Pap smear while on this medicine. You should also discuss the need for regular mammograms with your health care professional, and follow his or her guidelines for these tests. This medicine can make your body retain fluid, making your fingers, hands, or ankles swell. Your blood pressure can go up. Contact your doctor or health care professional if you feel you are retaining fluid. If you have any reason to think you are pregnant, stop taking this medicine right away and contact your doctor or health care professional. Smoking increases the risk of getting a blood clot or having  a stroke while you are taking this medicine, especially if you are more than 46 years  old. You are strongly advised not to smoke. If you wear contact lenses and notice visual changes, or if the lenses begin to feel uncomfortable, consult your eye doctor or health care professional. This medicine can increase the risk of developing a condition (endometrial hyperplasia) that may lead to cancer of the lining of the uterus. Taking progestins, another hormone drug, with this medicine lowers the risk of developing this condition. Therefore, if your uterus has not been removed (by a hysterectomy), your doctor may prescribe a progestin for you to take together with your estrogen. You should know, however, that taking estrogens with progestins may have additional health risks. You should discuss the use of estrogens and progestins with your health care professional to determine the benefits and risks for you. If you are going to have surgery, you may need to stop taking this medicine. Consult your health care professional for advice before you schedule the surgery. What side effects may I notice from receiving this medicine? Side effects that you should report to your doctor or health care professional as soon as possible: -allergic reactions like skin rash, itching or hives, swelling of the face, lips, or tongue -breast tissue changes or discharge -changes in vision -chest pain -confusion, trouble speaking or understanding -dark urine -general ill feeling or flu-like symptoms -light-colored stools -nausea, vomiting -pain, swelling, warmth in the leg -right upper belly pain -severe headaches -shortness of breath -sudden numbness or weakness of the face, arm or leg -trouble walking, dizziness, loss of balance or coordination -unusual vaginal bleeding -yellowing of the eyes or skin Side effects that usually do not require medical attention (report to your doctor or health care professional if they continue or are bothersome): -hair loss -increased hunger or thirst -increased  urination -symptoms of vaginal infection like itching, irritation or unusual discharge -unusually weak or tired This list may not describe all possible side effects. Call your doctor for medical advice about side effects. You may report side effects to FDA at 1-800-FDA-1088. Where should I keep my medicine? Keep out of the reach of children. Store at room temperature between 20 and 25 degrees C (68 and 77 degrees F). Keep container tightly closed. Protect from light. Throw away any unused medicine after the expiration date. NOTE: This sheet is a summary. It may not cover all possible information. If you have questions about this medicine, talk to your doctor, pharmacist, or health care provider.    2016, Elsevier/Gold Standard. (2010-12-09 [id number])

## 2016-03-29 NOTE — Progress Notes (Signed)
GYNECOLOGY  VISIT   HPI: 45 y.o.   Divorced  Caucasian  female   G1P1001 with No LMP recorded.   here for   6 week post op visit.   Status post TAH/LSO/LOA.  Having a lot of hot flashes and not sleeping well.   Some pressure with voiding.   On Lexapro 5 mg.  GYNECOLOGIC HISTORY: No LMP recorded. Contraception:  Hysterectomy Menopausal hormone therapy:  none Last mammogram:  11/24/15 BIRADS1 negative Last pap smear:   07/20/16 Pap and HR HPV negative        OB History    Gravida Para Term Preterm AB TAB SAB Ectopic Multiple Living   1 1 1       1          Patient Active Problem List   Diagnosis Date Noted  . Status post total abdominal hysterectomy 02/17/2016    Past Medical History  Diagnosis Date  . Amenorrhea   . Endometriosis   . Anxiety   . Infertility, female   . Diverticulitis   . Abnormal Pap smear of cervix 2016    CIN I  . Cervical stenosis (uterine cervix)   . SVD (spontaneous vaginal delivery)     x 1  . Seasonal allergies   . Depression   . Anemia     Hx    Past Surgical History  Procedure Laterality Date  . Oophorectomy Right 06/2003     Endometriosis   . Pelvic laparoscopy  2004    several. endometriosis   . Hysteroscopy  2003  . Endometrial ablation  2003  . Robotic assisted salpingo oopherectomy Left 12/09/2015    Procedure: LAPAROSCOPY WITH PELVIC WASHINGS & LYSIS OF ADHESIONS;  Surgeon: Nunzio Cobbs, MD;  Location: [facility];  Service: Gynecology;  Laterality: Left;  Dr. Quincy Simmonds called at 6:45am to tell team not to drape robot out. The team had already set the room up and draped the robot. The case was posted for a robotic salpingo oophorectomy  . Hysteroscopy w/d&c N/A 12/09/2015    Procedure: DILATATION AND CURETTAGE /HYSTEROSCOPY;  Surgeon: Nunzio Cobbs, MD;  Location: [facility];  Service: Gynecology;  Laterality: N/A;  . Colonoscopy    . Abdominal hysterectomy N/A 02/17/2016    Procedure: HYSTERECTOMY TOTAL ABDOMINAL  WITH LSO, EXTENSIVE LYSIS OF ADHESION AND POSSIBLE CYSTOSCOPY;  Surgeon: Nunzio Cobbs, MD;  Location: [facility];  Service: Gynecology;  Laterality: N/A;  LEFT SALPINGO OOPHORECTOMY and lysis of adhesions   . Cystoscopy N/A 02/17/2016    Procedure: CYSTOSCOPY;  Surgeon: Nunzio Cobbs, MD;  Location: [facility];  Service: Gynecology;  Laterality: N/A;    Current Outpatient Prescriptions  Medication Sig Dispense Refill  . amoxicillin-clavulanate (AUGMENTIN) 875-125 MG tablet Take by mouth.    . balanced salts (BSS) SOLN Inject 15 mLs into the eye once. 250 mL 0  . escitalopram (LEXAPRO) 5 MG tablet Take 1 tablet (5 mg total) by mouth daily. 30 tablet 4  . glucosamine-chondroitin 500-400 MG tablet Take 1 tablet by mouth daily.    [name] ibuprofen (ADVIL,MOTRIN) 800 MG tablet Take 1 tablet (800 mg total) by mouth every 8 (eight) hours as needed for mild pain. 30 tablet 1  . LORazepam (ATIVAN) 0.5 MG tablet Take 0.5 mg by mouth at bedtime as needed for sleep.     . Multiple Vitamin (MULTIVITAMIN) tablet Take 1 tablet by mouth daily.    . NON FORMULARY  Take 1 tablet by mouth daily. Calm-aid    . oxyCODONE-acetaminophen (PERCOCET/ROXICET) 5-325 MG tablet Take 1-2 tablets by mouth every 4 (four) hours as needed for severe pain (moderate to severe pain (when tolerating fluids)). 30 tablet 0   No current facility-administered medications for this visit.     ALLERGIES: Aspirin and Codeine  Family History  Problem Relation Age of Onset  . Diabetes Maternal Uncle   . Diabetes Maternal Uncle     Social History   Social History  . Marital Status: Divorced    Spouse Name: N/A  . Number of Children: N/A  . Years of Education: N/A   Occupational History  . Not on file.   Social History Main Topics  . Smoking status: Never Smoker   . Smokeless tobacco: Never Used  . Alcohol Use: 1.8 oz/week    3 Glasses of wine per week  . Drug Use: No  . Sexual Activity:    Partners: Male     Birth Control/ Protection: Surgical     Comment: TAH/LSO   Other Topics Concern  . Not on file   Social History Narrative    ROS:  Pertinent items are noted in HPI.  PHYSICAL EXAMINATION:    BP 102/60 mmHg  Pulse 60  Ht 5' 4.5" (1.638 m)  Wt 214 lb (97.07 kg)  BMI 36.18 kg/m2  LMP     General appearance: alert, cooperative and appears stated age     Abdomen: incision(s):Yes.  , Pfannensteil incision with minimal drainage left apex.  Pin point opening.  No tenderness.  Minimal erythema over a 7 mm area. Abdomen soft, non-tender, no masses,  no organomegaly    Pelvic: External genitalia:  no lesions              Urethra:  normal appearing urethra with no masses, tenderness or lesions              Bartholins and Skenes: normal                 Vagina: normal appearing vagina with normal color and discharge, no lesions              Cervix: absent.  Fibrin exudate at the vaginal apex.          Bimanual Exam:  Uterus:  uterus absent              Adnexa: no mass, fullness, tenderness           Chaperone was present for exam.  ASSESSMENT  Status post TAH/LSO/LOA. Doing well overall.  Voiding pressure. Menopausal symptoms.   Surgical menopause.   PLAN  Patient to use H2O2 to incision daily for cleansing.  No abx needed for incision. Check urine today.  Start Estrace 1 mg daily.  Discussed risks of DVT, PE, stroke, and possible increased risk of MI and breast cancer.  Follow up in 2 weeks for a recheck.  Continue decreased activity until then.  Annual exam is due in October 2017.    An After Visit Summary was printed and given to the patient.

## 2016-04-12 ENCOUNTER — Encounter: Payer: Self-pay | Admitting: Obstetrics and Gynecology

## 2016-04-12 ENCOUNTER — Ambulatory Visit (INDEPENDENT_AMBULATORY_CARE_PROVIDER_SITE_OTHER): Payer: 59 | Admitting: Obstetrics and Gynecology

## 2016-04-12 VITALS — BP 118/76 | HR 70 | Ht 64.5 in | Wt 212.8 lb

## 2016-04-12 DIAGNOSIS — Z9889 Other specified postprocedural states: Secondary | ICD-10-CM

## 2016-04-12 DIAGNOSIS — N958 Other specified menopausal and perimenopausal disorders: Secondary | ICD-10-CM

## 2016-04-12 DIAGNOSIS — E8940 Asymptomatic postprocedural ovarian failure: Secondary | ICD-10-CM

## 2016-04-12 NOTE — Progress Notes (Signed)
GYNECOLOGY  VISIT   HPI: 45 y.o.   Divorced  Caucasian  female   G1P1001 with No LMP recorded. Patient has had a hysterectomy.   here for   Recheck of the vaginal cuff. Is now about 8 weeks post op.   Taking Estrace 1 mg daily not regularly.  States she is worried that the endometriosis will return.  Night sweats and hot flashes are occurring.  Having some cognitive change.  No vaginal bleeding.   Some left sided cramping recently and right sided cramping last week.   Back at work full time.   GYNECOLOGIC HISTORY: No LMP recorded. Patient has had a hysterectomy.          OB History    Gravida Para Term Preterm AB Living   1 1 1     1    SAB TAB Ectopic Multiple Live Births           1         Patient Active Problem List   Diagnosis Date Noted  . Status post total abdominal hysterectomy 02/17/2016    Past Medical History:  Diagnosis Date  . Abnormal Pap smear of cervix 2016   CIN I  . Amenorrhea   . Anemia    Hx  . Anxiety   . Cervical stenosis (uterine cervix)   . Depression   . Diverticulitis   . Endometriosis   . Infertility, female   . Seasonal allergies   . SVD (spontaneous vaginal delivery)    x 1    Past Surgical History:  Procedure Laterality Date  . ABDOMINAL HYSTERECTOMY N/A 02/17/2016   Procedure: HYSTERECTOMY TOTAL ABDOMINAL WITH LSO, EXTENSIVE LYSIS OF ADHESION AND POSSIBLE CYSTOSCOPY;  Surgeon: Nunzio Cobbs, MD;  Location: [facility];  Service: Gynecology;  Laterality: N/A;  LEFT SALPINGO OOPHORECTOMY and lysis of adhesions   . COLONOSCOPY    . CYSTOSCOPY N/A 02/17/2016   Procedure: CYSTOSCOPY;  Surgeon: Nunzio Cobbs, MD;  Location: [facility];  Service: Gynecology;  Laterality: N/A;  . ENDOMETRIAL ABLATION  2003  . HYSTEROSCOPY  2003  . HYSTEROSCOPY W/D&C N/A 12/09/2015   Procedure: DILATATION AND CURETTAGE /HYSTEROSCOPY;  Surgeon: Nunzio Cobbs, MD;  Location: [facility];  Service: Gynecology;  Laterality: N/A;  .  OOPHORECTOMY Right 06/2003    Endometriosis   . PELVIC LAPAROSCOPY  2004   several. endometriosis   . ROBOTIC ASSISTED SALPINGO OOPHERECTOMY Left 12/09/2015   Procedure: LAPAROSCOPY WITH PELVIC WASHINGS & LYSIS OF ADHESIONS;  Surgeon: Nunzio Cobbs, MD;  Location: [facility];  Service: Gynecology;  Laterality: Left;  Dr. Quincy Simmonds called at 6:45am to tell team not to drape robot out. The team had already set the room up and draped the robot. The case was posted for a robotic salpingo oophorectomy    Current Outpatient Prescriptions  Medication Sig Dispense Refill  . escitalopram (LEXAPRO) 5 MG tablet Take 1 tablet (5 mg total) by mouth daily. 30 tablet 4  . estradiol (ESTRACE) 1 MG tablet Take 1 tablet (1 mg total) by mouth daily. 90 tablet 1  . glucosamine-chondroitin 500-400 MG tablet Take 1 tablet by mouth daily.    [name] ibuprofen (ADVIL,MOTRIN) 800 MG tablet Take 1 tablet (800 mg total) by mouth every 8 (eight) hours as needed for mild pain. 30 tablet 1  . LORazepam (ATIVAN) 0.5 MG tablet Take 0.5 mg by mouth at bedtime as needed for sleep.     [name]  Multiple Vitamin (MULTIVITAMIN) tablet Take 1 tablet by mouth daily.    . NON FORMULARY Take 1 tablet by mouth daily. Calm-aid     No current facility-administered medications for this visit.      ALLERGIES: Aspirin and Codeine  Family History  Problem Relation Age of Onset  . Diabetes Maternal Uncle   . Diabetes Maternal Uncle     Social History   Social History  . Marital status: Divorced    Spouse name: N/A  . Number of children: N/A  . Years of education: N/A   Occupational History  . Not on file.   Social History Main Topics  . Smoking status: Never Smoker  . Smokeless tobacco: Never Used  . Alcohol use 1.8 oz/week    3 Glasses of wine per week  . Drug use: No  . Sexual activity: Yes    Partners: Male    Birth control/ protection: Surgical     Comment: TAH/LSO   Other Topics Concern  . Not on file   Social  History Narrative  . No narrative on file    ROS:  Pertinent items are noted in HPI.  PHYSICAL EXAMINATION:    BP 118/76 (BP Location: Right Arm, Patient Position: Sitting, Cuff Size: Normal)   Pulse 70   Ht 5' 4.5" (1.638 m)   Wt 212 lb 12.8 oz (96.5 kg)   BMI 35.96 kg/m     General appearance: alert, cooperative and appears stated age   Abdomen: Pfannensteil incision well healed, soft, non-tender, no masses,  no organomegaly    Pelvic: External genitalia:  no lesions              Urethra:  normal appearing urethra with no masses, tenderness or lesions              Bartholins and Skenes: normal                 Vagina: vaginal apex with fibrin and white epithelium in left vaginal apex.  Treated with AgNO3.              Cervix: absent          Bimanual Exam:  Uterus:  uterus absent              Adnexa: no mass, fullness, tenderness              Chaperone was present for exam.  ASSESSMENT  Doing well status post TAH/LSO/LOA. Endometriosis.  Surgical menopause.  PLAN  OK to resume all normal activity including sexual activity in 1 week.  OK to start oral Estrace!  Reassurance given regarding minimal likelihood of stimulating any endometriosis remnant. Annual exam and recheck in beginning of Nov. 2017.    An After Visit Summary was printed and given to the patient.

## 2016-04-12 NOTE — Patient Instructions (Signed)
Please start your oral Estrace! I will see you back in November for your annual check up and we will reassess your hormones then.

## 2016-07-26 ENCOUNTER — Ambulatory Visit (INDEPENDENT_AMBULATORY_CARE_PROVIDER_SITE_OTHER): Payer: 59 | Admitting: Obstetrics and Gynecology

## 2016-07-26 ENCOUNTER — Encounter: Payer: Self-pay | Admitting: Obstetrics and Gynecology

## 2016-07-26 VITALS — BP 122/84 | HR 64 | Ht 64.5 in | Wt 221.0 lb

## 2016-07-26 DIAGNOSIS — Z01419 Encounter for gynecological examination (general) (routine) without abnormal findings: Secondary | ICD-10-CM

## 2016-07-26 DIAGNOSIS — Z0000 Encounter for general adult medical examination without abnormal findings: Secondary | ICD-10-CM

## 2016-07-26 LAB — POCT URINALYSIS DIPSTICK
BILIRUBIN UA: NEGATIVE
Glucose, UA: NEGATIVE
Ketones, UA: NEGATIVE
Leukocytes, UA: NEGATIVE
NITRITE UA: NEGATIVE
PH UA: 6
Protein, UA: NEGATIVE
RBC UA: NEGATIVE
Urobilinogen, UA: NEGATIVE

## 2016-07-26 MED ORDER — ESTRADIOL 1 MG PO TABS: ORAL_TABLET | ORAL | 3 refills | Status: DC

## 2016-07-26 MED ORDER — ESCITALOPRAM OXALATE 5 MG PO TABS: 5.0000 mg | ORAL_TABLET | Freq: Every day | ORAL | 3 refills | Status: DC

## 2016-07-26 NOTE — Progress Notes (Signed)
45 y.o. G35P1001 Divorced Caucasian female here for annual exam.    Feeling forgetful.  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 Thinks this is much worse than it used to be since prior to hysterectomy.  Occasional hot flash and night sweat.  Feels like she wakes up tired. Happy taking the Estradiol.  Occasional sees PMS.  Taking Lexapro and doing well with that.   PCP:   Dr. Stanford Scotland  Patient's last menstrual period was 02/03/2016 (exact date).           Sexually active: No.  The current method of family planning is partner with vasectomy.    Exercising: Yes.    Home exercise routine includes aerobic exercise less than once per week due to being busy at work. Smoker:  no  Health Maintenance: Pap:  07-21-15 Neg:Neg HR HPV History of abnormal Pap:  Yes, CIN1 2016.   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 Pathology showed koilocytic atypia and endometriosis and ECC showed acute and chronic inflammation. Pap 10/17/13 - LGSIL and negative HR HPV, Pap 03/19/14 - Neg and meg HR HPV, Pap 07/15/14 - negative and no high risk HPV testing, Pap 01/20/15 - negative but no endocervical cells and no high risk HPV testing, Pap 02/24/15 - negative pap and no HR HPV testing done.  Specimen was adequate.  Hysterectomy specimen - benign cervix. MMG:  11-24-15 Density B/Neg/BiRads1:[facility] Colonoscopy:  2015 Diverticulitis BMD:   n/a  Result  n/a TDaP:  2014 Gardasil:   N/A Hep C: Not indicated due to age Screening Labs:  Hb today: not drawn, Urine today - negative.    reports that she has never smoked. She has never used smokeless tobacco. She reports that she drinks about 1.8 oz of alcohol per week . She reports that she does not use drugs.  Past Medical History:  Diagnosis Date  . Abnormal Pap smear of cervix 2016   CIN I  . Amenorrhea   . Anemia    Hx  . Anxiety   . Cervical stenosis (uterine cervix)   . Depression   . Diverticulitis   . Endometriosis   . Infertility, female    . Seasonal allergies   . SVD (spontaneous vaginal delivery)    x 1    Past Surgical History:  Procedure Laterality Date  . ABDOMINAL HYSTERECTOMY N/A 02/17/2016   Procedure: HYSTERECTOMY TOTAL ABDOMINAL WITH LSO, EXTENSIVE LYSIS OF ADHESION AND POSSIBLE CYSTOSCOPY;  Surgeon: Nunzio Cobbs, MD;  Location: [facility];  Service: Gynecology;  Laterality: N/A;  LEFT SALPINGO OOPHORECTOMY and lysis of adhesions   . COLONOSCOPY    . CYSTOSCOPY N/A 02/17/2016   Procedure: CYSTOSCOPY;  Surgeon: Nunzio Cobbs, MD;  Location: [facility];  Service: Gynecology;  Laterality: N/A;  . ENDOMETRIAL ABLATION  2003  . HYSTEROSCOPY  2003  . HYSTEROSCOPY W/D&C N/A 12/09/2015   Procedure: DILATATION AND CURETTAGE /HYSTEROSCOPY;  Surgeon: Nunzio Cobbs, MD;  Location: [facility];  Service: Gynecology;  Laterality: N/A;  . OOPHORECTOMY Right 06/2003    Endometriosis   . PELVIC LAPAROSCOPY  2004   several. endometriosis   . ROBOTIC ASSISTED SALPINGO OOPHERECTOMY Left 12/09/2015   Procedure: LAPAROSCOPY WITH PELVIC WASHINGS & LYSIS OF ADHESIONS;  Surgeon: Nunzio Cobbs, MD;  Location: [facility];  Service: Gynecology;  Laterality: Left;  Dr. Quincy Simmonds called at 6:45am to tell team not to drape robot out. The team had already set the  room up and draped the robot. The case was posted for a robotic salpingo oophorectomy    Current Outpatient Prescriptions  Medication Sig Dispense Refill  . escitalopram (LEXAPRO) 5 MG tablet Take 1 tablet (5 mg total) by mouth daily. 30 tablet 4  . estradiol (ESTRACE) 1 MG tablet Take 1 tablet (1 mg total) by mouth daily. 90 tablet 1  . glucosamine-chondroitin 500-400 MG tablet Take 1 tablet by mouth daily.    [name] ibuprofen (ADVIL,MOTRIN) 800 MG tablet Take 1 tablet (800 mg total) by mouth every 8 (eight) hours as needed for mild pain. 30 tablet 1  . LORazepam (ATIVAN) 0.5 MG tablet Take 0.5 mg by mouth at bedtime as needed for sleep.     . Multiple Vitamin  (MULTIVITAMIN) tablet Take 1 tablet by mouth daily.    . NON FORMULARY Take 1 tablet by mouth daily. Calm-aid     No current facility-administered medications for this visit.     Family History  Problem Relation Age of Onset  . Diabetes Maternal Uncle   . Diabetes Maternal Uncle     ROS:  Pertinent items are noted in HPI.  Otherwise, a comprehensive ROS was negative.  Exam:   LMP 02/03/2016 (Exact Date)     General appearance: alert, cooperative and appears stated age Head: Normocephalic, without obvious abnormality, atraumatic Neck: no adenopathy, supple, symmetrical, trachea midline and thyroid normal to inspection and palpation Lungs: clear to auscultation bilaterally Breasts: normal appearance, no masses or tenderness, No nipple retraction or dimpling, No nipple discharge or bleeding, No axillary or supraclavicular adenopathy Heart: regular rate and rhythm Abdomen: soft, non-tender; no masses, no organomegaly Extremities: extremities normal, atraumatic, no cyanosis or edema Skin: Skin color, texture, turgor normal. No rashes or lesions Lymph nodes: Cervical, supraclavicular, and axillary nodes normal. No abnormal inguinal nodes palpated Neurologic: Grossly normal  Pelvic: External genitalia:  no lesions              Urethra:  normal appearing urethra with no masses, tenderness or lesions              Bartholins and Skenes: normal                 Vagina: normal appearing vagina with normal color and discharge, no lesions              Cervix: absent              Pap taken: No. Bimanual Exam:  Uterus: absent.              Adnexa: no mass, fullness, tenderness              Rectal exam: Yes.  .  Confirms.              Anus:  normal sphincter tone, no lesions  Chaperone was present for exam.  Assessment:   Well woman visit with normal exam. Status post TAH/ left salpingo-oophorectomy.  Status post prior right salpingo-oophorectomy.  Endometriosis. On ERT.  Poor focus.   Anxiety controlled on Lexapro low dose.  Plan: Yearly mammogram recommended after age 83.  Recommended self breast exam.  Pap and HR HPV as above. Guidelines for Calcium, Vitamin D, regular exercise program including cardiovascular and weight bearing exercise. Increase estradiol to 2 mg daily.   Continue Lexapro. Routine labs. FU in 2 mo. Follow up annually and prn.       After visit summary provided.

## 2016-07-26 NOTE — Patient Instructions (Signed)

## 2016-07-27 LAB — COMPREHENSIVE METABOLIC PANEL
ALBUMIN: 4 g/dL (ref 3.6–5.1)
ALT: 21 U/L (ref 6–29)
AST: 29 U/L (ref 10–35)
Alkaline Phosphatase: 79 U/L (ref 33–115)
BUN: 11 mg/dL (ref 7–25)
CHLORIDE: 107 mmol/L (ref 98–110)
CO2: 24 mmol/L (ref 20–31)
Calcium: 9.1 mg/dL (ref 8.6–10.2)
Creat: 0.79 mg/dL (ref 0.50–1.10)
Glucose, Bld: 77 mg/dL (ref 65–99)
POTASSIUM: 4.9 mmol/L (ref 3.5–5.3)
Sodium: 140 mmol/L (ref 135–146)
TOTAL PROTEIN: 7.1 g/dL (ref 6.1–8.1)
Total Bilirubin: 0.4 mg/dL (ref 0.2–1.2)

## 2016-07-27 LAB — LIPID PANEL
Cholesterol: 174 mg/dL (ref <200)
HDL: 59 mg/dL (ref >50)
LDL CALC: 98 mg/dL
TRIGLYCERIDES: 86 mg/dL (ref <150)
Total CHOL/HDL Ratio: 2.9 Ratio (ref <5.0)
VLDL: 17 mg/dL (ref <30)

## 2016-07-27 LAB — CBC
HCT: 44.3 % (ref 35.0–45.0)
HEMOGLOBIN: 14.3 g/dL (ref 11.7–15.5)
MCH: 28.1 pg (ref 27.0–33.0)
MCHC: 32.3 g/dL (ref 32.0–36.0)
MCV: 87 fL (ref 80.0–100.0)
MPV: 8.5 fL (ref 7.5–12.5)
Platelets: 297 10*3/uL (ref 140–400)
RBC: 5.09 MIL/uL (ref 3.80–5.10)
RDW: 16.6 % — ABNORMAL HIGH (ref 11.0–15.0)
WBC: 5.9 10*3/uL (ref 3.8–10.8)

## 2016-07-27 LAB — VITAMIN D 25 HYDROXY (VIT D DEFICIENCY, FRACTURES): VIT D 25 HYDROXY: 35 ng/mL (ref 30–100)

## 2016-07-27 LAB — TSH: TSH: 2.92 mIU/L

## 2016-08-16 ENCOUNTER — Telehealth: Payer: Self-pay | Admitting: *Deleted

## 2016-08-16 NOTE — Telephone Encounter (Signed)
-----   Message from Nunzio Cobbs, MD sent at 08/15/2016  4:26 PM EST ----- Regarding: RE: recall [place] to remove from all pap recall.  Hx LGSIL.  She ended up having a hysterectomy for endometriosis and final cervical pathology showed no dysplasia.   Thanks,   Brook ----- Message ----- From: Nicholes Rough, CMA Sent: 08/03/2016   3:06 PM To: Nunzio Cobbs, MD Subject: recall 08                                      Dr. Quincy Simmonds, This patient is in 08 recall for 06/2016. She was seen on 07-26-16 for AEX. No PAP was collected. Please advise on recall status Thanks Margaretha Sheffield

## 2016-08-16 NOTE — Telephone Encounter (Signed)
Removed from recall -eh 

## 2016-09-27 ENCOUNTER — Encounter: Payer: Self-pay | Admitting: Obstetrics and Gynecology

## 2016-09-27 ENCOUNTER — Ambulatory Visit (INDEPENDENT_AMBULATORY_CARE_PROVIDER_SITE_OTHER): Payer: 59 | Admitting: Obstetrics and Gynecology

## 2016-09-27 VITALS — BP 116/70 | HR 76 | Resp 16 | Ht 64.0 in | Wt 227.0 lb

## 2016-09-27 DIAGNOSIS — F411 Generalized anxiety disorder: Secondary | ICD-10-CM | POA: Diagnosis not present

## 2016-09-27 DIAGNOSIS — Z79899 Other long term (current) drug therapy: Secondary | ICD-10-CM | POA: Diagnosis not present

## 2016-09-27 MED ORDER — BUPROPION HCL ER (XL) 150 MG PO TB24: 150.0000 mg | ORAL_TABLET | Freq: Every day | ORAL | 1 refills | Status: DC

## 2016-09-27 NOTE — Progress Notes (Signed)
GYNECOLOGY  VISIT   HPI: 46 y.o.   Divorced  Caucasian  female   G1P1001 with Patient's last menstrual period was 02/03/2016 (exact date).   here for   2 month recheck.  Estradiol increased to 2 mg daily at last visit on 07/26/16.  Patient was having poor focus and occasional hot flash and night sweats. Was waking up tired.  Sleeping better.  Heat is better controlled. Thinks she is more forgetful, but this is better.  On Lexapro.  Concerned about weight gain.  Asking about switching to Wellbutrin.  Friend's son just committed suicide around  time.  Does mindful exercises for herself. 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  Has a gym membership and is thinking about getting back into the routine.   Recent yeast infections.  Treated with OTC med.  GYNECOLOGIC HISTORY: Patient's last menstrual period was 02/03/2016 (exact date). Contraception:  Vasectomy Menopausal hormone therapy:  Estradiol  Last mammogram:  11-24-15 Density B/Neg/BiRads1:[facility] Last pap smear:   07-21-15 Neg:Neg HR HPV Colposcopy 10/24/13 for pap on 10/17/13 showing LGSIL. Pathology showed koilocytic atypia and endometriosis and ECC showed acute and chronic inflammation. Pap 10/17/13 - LGSIL and negative HR HPV, Pap 03/19/14 - Neg and meg HR HPV, Pap 07/15/14 - negative and no high risk HPV testing, Pap 01/20/15 - negative but no endocervical cells and no high risk HPV testing, Pap 02/24/15 - negative pap and no HR HPV testing done. Specimen was adequate.  Hysterectomy specimen - benign cervix.        OB History    Gravida Para Term Preterm AB Living   1 1 1  0 0 1   SAB TAB Ectopic Multiple Live Births   0 0 0 0 1         Patient Active Problem List   Diagnosis Date Noted  . Status post total abdominal hysterectomy 02/17/2016    Past Medical History:  Diagnosis Date  . Abnormal Pap smear of cervix 2016   CIN I  . Amenorrhea   . Anemia    Hx  . Anxiety   . Cervical  stenosis (uterine cervix)   . Depression   . Diverticulitis   . Endometriosis   . Infertility, female   . Seasonal allergies   . SVD (spontaneous vaginal delivery)    x 1    Past Surgical History:  Procedure Laterality Date  . ABDOMINAL HYSTERECTOMY N/A 02/17/2016   Procedure: HYSTERECTOMY TOTAL ABDOMINAL WITH LSO, EXTENSIVE LYSIS OF ADHESION AND POSSIBLE CYSTOSCOPY;  Surgeon: Nunzio Cobbs, MD;  Location: [facility];  Service: Gynecology;  Laterality: N/A;  LEFT SALPINGO OOPHORECTOMY and lysis of adhesions   . COLONOSCOPY    . CYSTOSCOPY N/A 02/17/2016   Procedure: CYSTOSCOPY;  Surgeon: Nunzio Cobbs, MD;  Location: [facility];  Service: Gynecology;  Laterality: N/A;  . ENDOMETRIAL ABLATION  2003  . HYSTEROSCOPY  2003  . HYSTEROSCOPY W/D&C N/A 12/09/2015   Procedure: DILATATION AND CURETTAGE /HYSTEROSCOPY;  Surgeon: Nunzio Cobbs, MD;  Location: [facility];  Service: Gynecology;  Laterality: N/A;  . OOPHORECTOMY Right 06/2003    Endometriosis   . PELVIC LAPAROSCOPY  2004   several. endometriosis   . ROBOTIC ASSISTED SALPINGO OOPHERECTOMY Left 12/09/2015   Procedure: LAPAROSCOPY WITH PELVIC WASHINGS & LYSIS OF ADHESIONS;  Surgeon: Nunzio Cobbs, MD;  Location: [facility];  Service: Gynecology;  Laterality: Left;  Dr. Quincy Simmonds called  at 6:45am to tell team not to drape robot out. The team had already set the room up and draped the robot. The case was posted for a robotic salpingo oophorectomy    Current Outpatient Prescriptions  Medication Sig Dispense Refill  . escitalopram (LEXAPRO) 5 MG tablet Take 1 tablet (5 mg total) by mouth daily. 90 tablet 3  . estradiol (ESTRACE) 1 MG tablet Take 2 tablets ( 2 mg total) per day. 180 tablet 3  . glucosamine-chondroitin 500-400 MG tablet Take 1 tablet by mouth daily.    [name] ibuprofen (ADVIL,MOTRIN) 800 MG tablet Take 1 tablet (800 mg total) by mouth every 8 (eight) hours as needed for mild pain. 30 tablet 1  . LORazepam  (ATIVAN) 0.5 MG tablet Take 0.5 mg by mouth at bedtime as needed for sleep.     . Multiple Vitamin (MULTIVITAMIN) tablet Take 1 tablet by mouth daily.    . NON FORMULARY Take 1 tablet by mouth daily. Calm-aid     No current facility-administered medications for this visit.      ALLERGIES: Aspirin and Codeine  Family History  Problem Relation Age of Onset  . Diabetes Maternal Uncle   . Diabetes Maternal Grandmother   . Stroke Maternal Grandmother   . Hypertension Maternal Grandmother   . Diabetes Maternal Uncle   . Lung disease Maternal Grandfather     related to working in saw [place] History  . Marital status: Divorced    Spouse name: N/A  . Number of children: N/A  . Years of education: N/A   Occupational History  . Not on file.   Social History Main Topics  . Smoking status: Never Smoker  . Smokeless tobacco: Never Used  . Alcohol use 1.8 oz/week    3 Glasses of wine per week  . Drug use: No  . Sexual activity: Not Currently    Partners: Male    Birth control/ protection: Surgical     Comment: TAH/LSO   Other Topics Concern  . Not on file   Social History Narrative  . No narrative on file    ROS:  Pertinent items are noted in HPI.  PHYSICAL EXAMINATION:    BP 116/70 (BP Location: Right Arm, Patient Position: Sitting, Cuff Size: Normal)   Pulse 76   Resp 16   Ht 5\' 4"  (1.626 m)   Wt 227 lb (103 kg)   LMP 02/03/2016 (Exact Date)   BMI 38.96 kg/m     General appearance: alert, cooperative and appears stated age   ASSESSMENT  Menopausal symptoms controlled on estrogen therapy.  Anxiety.  Weight gain on Lexapro.  PLAN  Continue Estradiol 1 mg, 2 tabs daily. Stop Lexapro.  Start Wellbutrin XL 150 mg daily.  #30, RF one.   Side effects discussed. She will call or send a message through My Chart to let me know how she is doing on this dosage.  Encouraged exercise.  Suggesting counseling.  I have her brochure for [employer]  counseling and for [employer]  She will consider.  Follow up for annual exam and prn.    An After Visit Summary was printed and given to the patient.  __15____ minutes face to face time of which over 50% was spent in counseling.

## 2016-11-30 ENCOUNTER — Encounter: Payer: Self-pay | Admitting: Obstetrics and Gynecology

## 2016-12-01 ENCOUNTER — Telehealth: Payer: Self-pay | Admitting: *Deleted

## 2016-12-01 MED ORDER — BUPROPION HCL ER (XL) 300 MG PO TB24: 300.0000 mg | ORAL_TABLET | Freq: Every day | ORAL | 8 refills | Status: DC

## 2016-12-01 NOTE — Telephone Encounter (Signed)
Spoke with patient, advised as seen below per Dr. Quincy Simmonds. Patient states she would like to increase Wellbutrin XL to 300 mg daily. Advised patient new order placed for Wellbutrin at verified pharmacy on file for #30/8RF. Patient verbalizes understanding and is agreeable.  Routing to provider for final review. Patient is agreeable to disposition. Will close encounter.

## 2016-12-01 NOTE — Telephone Encounter (Signed)
See telephone encounter dated 12/01/16.

## 2016-12-01 NOTE — Telephone Encounter (Signed)
Med refill request: Wellbutrin XL 150 mg 24 hr tab, daily Last AEX: 07/26/16 Next AEX: 08/01/17 Last MMG (if hormonal med) Refill authorized: Dr. Quincy Simmonds, please Advise?     From Emelda Fear To Theresa Cobbs, MD Sent 11/30/2016 3:06 PM  Hi Dr. Quincy Simmonds,  I need a refill on the Welbutrin; I'll be out by Thursday. I think it's been working fairly well. I have felt slightly more irritable in some situations but it doesn't feel like anything unmanageable.   Thanks!  Theresa Singleton

## 2016-12-01 NOTE — Telephone Encounter (Signed)
Please contact patient to see if she wants to increase to Wellbutrin XL 300 mg daily.  If she does, ok to refill until annual exam.  If she does not, ok to refill Wellbutrin XL 150 mg daily until annual exam.

## 2017-01-06 ENCOUNTER — Other Ambulatory Visit: Payer: Self-pay | Admitting: Obstetrics and Gynecology

## 2017-01-06 DIAGNOSIS — Z1231 Encounter for screening mammogram for malignant neoplasm of breast: Secondary | ICD-10-CM

## 2017-01-24 ENCOUNTER — Ambulatory Visit
Admission: RE | Admit: 2017-01-24 | Discharge: 2017-01-24 | Disposition: A | Discharge status: Home / Self Care | Payer: 59 | Source: Ambulatory Visit | Attending: Obstetrics and Gynecology | Admitting: Obstetrics and Gynecology

## 2017-01-24 DIAGNOSIS — Z1231 Encounter for screening mammogram for malignant neoplasm of breast: Secondary | ICD-10-CM

## 2017-07-29 NOTE — Progress Notes (Deleted)
46 y.o. G42P1001 Divorced Caucasian female here for annual exam.    PCP:     Patient's last menstrual period was 02/03/2016 (exact date).           Sexually active: {yes no:314532}  The current method of family planning is vasectomy and status post hysterectomy.    Exercising: {yes no:314532}  {types:19826} Smoker:  no  Health Maintenance: Pap: 07-21-15 Neg:Neg HR HPV, 02-24-15 Neg History of abnormal Pap:  Yes, CIN1 2016. 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 Pathology showed koilocytic atypia and endometriosis and ECC showed acute and chronic inflammation. Pap 10/17/13 - LGSIL and negative HR HPV, Pap 03/19/14 - Neg and meg HR HPV, Pap 07/15/14 - negative and no high risk HPV testing, Pap 01/20/15 - negative but no endocervical cells and no high risk HPV testing, Pap 02/24/15 - negative pap and no HR HPV testing done. Specimen was adequate.  Hysterectomy specimen - benign cervix. MMG: 01-24-17 Density B/Neg/BiRads1:[facility] Colonoscopy: ***?2015 Diverticulitis BMD:   n/a  Result  n/a TDaP:  2014 Gardasil:   no HIV: *** Hep C:*** Screening Labs:  Hb today: ***, Urine today: ***   reports that  has never smoked. she has never used smokeless tobacco. She reports that she drinks about 1.8 oz of alcohol per week. She reports that she does not use drugs.  Past Medical History:  Diagnosis Date  . Abnormal Pap smear of cervix 2016   CIN I  . Amenorrhea   . Anemia    Hx  . Anxiety   . Cervical stenosis (uterine cervix)   . Depression   . Diverticulitis   . Endometriosis   . Infertility, female   . Seasonal allergies   . SVD (spontaneous vaginal delivery)    x 1    Past Surgical History:  Procedure Laterality Date  . COLONOSCOPY    . ENDOMETRIAL ABLATION  2003  . HYSTEROSCOPY  2003  . OOPHORECTOMY Right 06/2003    Endometriosis   . PELVIC LAPAROSCOPY  2004   several. endometriosis     Current Outpatient Medications  Medication Sig Dispense Refill  . buPROPion  (WELLBUTRIN XL) 300 MG 24 hr tablet Take 1 tablet (300 mg total) by mouth daily. 30 tablet 8  . estradiol (ESTRACE) 1 MG tablet Take 2 tablets ( 2 mg total) per day. 180 tablet 3  . glucosamine-chondroitin 500-400 MG tablet Take 1 tablet by mouth daily.    [name] ibuprofen (ADVIL,MOTRIN) 800 MG tablet Take 1 tablet (800 mg total) by mouth every 8 (eight) hours as needed for mild pain. 30 tablet 1  . LORazepam (ATIVAN) 0.5 MG tablet Take 0.5 mg by mouth at bedtime as needed for sleep.     . Multiple Vitamin (MULTIVITAMIN) tablet Take 1 tablet by mouth daily.    . NON FORMULARY Take 1 tablet by mouth daily. Calm-aid     No current facility-administered medications for this visit.     Family History  Problem Relation Age of Onset  . Diabetes Maternal Uncle   . Diabetes Maternal Grandmother   . Stroke Maternal Grandmother   . Hypertension Maternal Grandmother   . Diabetes Maternal Uncle   . Lung disease Maternal Grandfather        related to working in saw mill    ROS:  Pertinent items are noted in HPI.  Otherwise, a comprehensive ROS was negative.  Exam:   LMP 02/03/2016 (Exact Date)     General appearance: alert, cooperative and  appears stated age Head: Normocephalic, without obvious abnormality, atraumatic Neck: no adenopathy, supple, symmetrical, trachea midline and thyroid normal to inspection and palpation Lungs: clear to auscultation bilaterally Breasts: normal appearance, no masses or tenderness, No nipple retraction or dimpling, No nipple discharge or bleeding, No axillary or supraclavicular adenopathy Heart: regular rate and rhythm Abdomen: soft, non-tender; no masses, no organomegaly Extremities: extremities normal, atraumatic, no cyanosis or edema Skin: Skin color, texture, turgor normal. No rashes or lesions Lymph nodes: Cervical, supraclavicular, and axillary nodes normal. No abnormal inguinal nodes palpated Neurologic: Grossly normal  Pelvic: External genitalia:  no  lesions              Urethra:  normal appearing urethra with no masses, tenderness or lesions              Bartholins and Skenes: normal                 Vagina: normal appearing vagina with normal color and discharge, no lesions              Cervix: no lesions              Pap taken: {yes no:314532} Bimanual Exam:  Uterus:  normal size, contour, position, consistency, mobility, non-tender              Adnexa: no mass, fullness, tenderness              Rectal exam: {yes no:314532}.  Confirms.              Anus:  normal sphincter tone, no lesions  Chaperone was present for exam.  Assessment:   Well woman visit with normal exam.   Plan: Mammogram screening discussed. Recommended self breast awareness. Pap and HR HPV as above. Guidelines for Calcium, Vitamin D, regular exercise program including cardiovascular and weight bearing exercise.   Follow up annually and prn.   Additional counseling given.  {yes [id number]. _______ minutes face to face time of which over 50% was spent in counseling.    After visit summary provided.

## 2017-08-01 ENCOUNTER — Ambulatory Visit: Payer: 59 | Admitting: Obstetrics and Gynecology

## 2017-08-08 ENCOUNTER — Other Ambulatory Visit: Payer: Self-pay

## 2017-08-08 ENCOUNTER — Ambulatory Visit (INDEPENDENT_AMBULATORY_CARE_PROVIDER_SITE_OTHER): Payer: 59 | Admitting: Obstetrics and Gynecology

## 2017-08-08 ENCOUNTER — Encounter: Payer: Self-pay | Admitting: Obstetrics and Gynecology

## 2017-08-08 ENCOUNTER — Ambulatory Visit: Payer: 59 | Admitting: Obstetrics and Gynecology

## 2017-08-08 VITALS — BP 130/86 | HR 72 | Resp 14 | Ht 64.0 in | Wt 223.0 lb

## 2017-08-08 DIAGNOSIS — Z01419 Encounter for gynecological examination (general) (routine) without abnormal findings: Secondary | ICD-10-CM | POA: Diagnosis not present

## 2017-08-08 MED ORDER — ESTRADIOL 1 MG PO TABS: ORAL_TABLET | ORAL | 11 refills | Status: DC

## 2017-08-08 MED ORDER — BUPROPION HCL ER (XL) 300 MG PO TB24: 300.0000 mg | ORAL_TABLET | Freq: Every day | ORAL | 11 refills | Status: DC

## 2017-08-08 NOTE — Progress Notes (Signed)
46 y.o. G34P1001 Divorced Caucasian female here for annual exam.    Increased irritability.  Hx decreased locus. 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  Hot flashes.  On Estrace 2 mg daily.   Using Wellbutrin 300 mg daily. Working well.  No change in partner.  PCP:   Dr. Rolanda Lundborg   Patient's last menstrual period was 02/03/2016 (exact date).           Sexually active: No.  The current method of family planning is status post hysterectomy.    Exercising: Yes.    aerobics Smoker:  no  Health Maintenance: Pap: 07-21-15 Neg:Neg HR HPV, 02-24-15 Neg, 07-15-14 Neg History of abnormal Pap:  Yes,  CIN1 2016. 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 Pathology showed koilocytic atypia and endometriosis and ECC showed acute and chronic inflammation. Pap 10/17/13 - LGSIL and negative HR HPV, Pap 03/19/14 - Neg and meg HR HPV, Pap 07/15/14 - negative and no high risk HPV testing, Pap 01/20/15 - negative but no endocervical cells and no high risk HPV testing, Pap 02/24/15 - negative pap and no HR HPV testing done. Specimen was adequate.  Hysterectomy specimen - benign cervix. MMG: 01-24-17 Density B/Neg/BiRads1:TBC Colonoscopy: 2015 Diverticulitis BMD:   n/a  Result  n/a TDaP:  2014 Gardasil:   no HIV: in the past Hep C: not indicated  Screening Labs: discuss with provider  Hb today: same, Urine today: not collected    reports that  has never smoked. she has never used smokeless tobacco. She reports that she drinks about 1.8 oz of alcohol per week. She reports that she does not use drugs.  Past Medical History:  Diagnosis Date  . Abnormal Pap smear of cervix 2016   CIN I  . Amenorrhea   . Anemia    Hx  . Anxiety   . Cervical stenosis (uterine cervix)   . Depression   . Diverticulitis   . Endometriosis   . Infertility, female   . Seasonal allergies   . SVD (spontaneous vaginal delivery)    x 1    Past Surgical History:  Procedure  Laterality Date  . COLONOSCOPY    . CYSTOSCOPY N/A 02/17/2016   Performed by Nunzio Cobbs, MD at [facility] ORS  . DILATATION AND CURETTAGE /HYSTEROSCOPY N/A 12/09/2015   Performed by Nunzio Cobbs, MD at [facility] ORS  . ENDOMETRIAL ABLATION  2003  . HYSTERECTOMY TOTAL ABDOMINAL WITH LSO, EXTENSIVE LYSIS OF ADHESION AND POSSIBLE CYSTOSCOPY N/A 02/17/2016   Performed by Nunzio Cobbs, MD at [facility] ORS  . HYSTEROSCOPY  2003  . LAPAROSCOPY WITH PELVIC WASHINGS & LYSIS OF ADHESIONS Left 12/09/2015   Performed by Nunzio Cobbs, MD at [facility] ORS  . OOPHORECTOMY Right 06/2003    Endometriosis   . PELVIC LAPAROSCOPY  2004   several. endometriosis     Current Outpatient Medications  Medication Sig Dispense Refill  . buPROPion (WELLBUTRIN XL) 300 MG 24 hr tablet Take 1 tablet (300 mg total) by mouth daily. 30 tablet 8  . estradiol (ESTRACE) 1 MG tablet Take 2 tablets ( 2 mg total) per day. 180 tablet 3  . glucosamine-chondroitin 500-400 MG tablet Take 1 tablet by mouth daily.    [name] ibuprofen (ADVIL,MOTRIN) 800 MG tablet Take 1 tablet (800 mg total) by mouth every 8 (eight) hours as needed for mild pain. 30 tablet 1  . LORazepam (ATIVAN) 0.5  MG tablet Take 0.5 mg by mouth at bedtime as needed for sleep.     . Multiple Vitamin (MULTIVITAMIN) tablet Take 1 tablet by mouth daily.    . NON FORMULARY Take 1 tablet by mouth daily. Calm-aid     No current facility-administered medications for this visit.     Family History  Problem Relation Age of Onset  . Diabetes Maternal Uncle   . Diabetes Maternal Grandmother   . Stroke Maternal Grandmother   . Hypertension Maternal Grandmother   . Diabetes Maternal Uncle   . Lung disease Maternal Grandfather        related to working in saw mill    ROS:  Pertinent items are noted in HPI.  Otherwise, a comprehensive ROS was negative.  Exam:   BP 130/86 (BP Location: Right Arm, Patient Position: Sitting, Cuff Size: Normal)   Pulse  72   Resp 14   Ht 5\' 4"  (1.626 m)   Wt 223 lb (101.2 kg)   LMP 02/03/2016 (Exact Date)   BMI 38.28 kg/m     General appearance: alert, cooperative and appears stated age Head: Normocephalic, without obvious abnormality, atraumatic Neck: no adenopathy, supple, symmetrical, trachea midline and thyroid normal to inspection and palpation Lungs: clear to auscultation bilaterally Breasts: normal appearance, no masses or tenderness, No nipple retraction or dimpling, No nipple discharge or bleeding, No axillary or supraclavicular adenopathy Heart: regular rate and rhythm Abdomen: soft, non-tender; no masses, no organomegaly Extremities: extremities normal, atraumatic, no cyanosis or edema Skin: Skin color, texture, turgor normal. No rashes or lesions Lymph nodes: Cervical, supraclavicular, and axillary nodes normal. No abnormal inguinal nodes palpated Neurologic: Grossly normal  Pelvic: External genitalia:  no lesions              Urethra:  normal appearing urethra with no masses, tenderness or lesions              Bartholins and Skenes: normal                 Vagina: normal appearing vagina with normal color and discharge, no lesions              Cervix: absent.               Pap taken: No. Bimanual Exam:  Uterus:   Absent.               Adnexa: no mass, fullness, tenderness              Rectal exam: Yes.  .  Confirms.              Anus:  normal sphincter tone, no lesions  Chaperone was present for exam.  Assessment:   Well woman visit with normal exam. Status post TAH/left salpingo-oophorectomy.  Status post prior right salpingo-oophorectomy.  Endometriosis. ERT. Depression.   Plan: Mammogram screening discussed. Recommended self breast awareness. Pap and HR HPV as above. Guidelines for Calcium, Vitamin D, regular exercise program including cardiovascular and weight bearing exercise. Continue Estrace 2 mg daily.  We reviewed benefits and risks including stroke, DVT, and  PE. Refill Wellbutrin.  I offered to refer her to a psychiatrist in [place] to discuss her focus and irritability.  She will consider.  Routine labs. Follow up annually and prn.      After visit summary provided.

## 2017-08-08 NOTE — Patient Instructions (Signed)

## 2017-08-09 LAB — TSH: TSH: 4.44 u[IU]/mL (ref 0.450–4.500)

## 2017-08-09 LAB — COMPREHENSIVE METABOLIC PANEL
ALBUMIN: 4.1 g/dL (ref 3.5–5.5)
ALT: 24 IU/L (ref 0–32)
AST: 28 IU/L (ref 0–40)
Albumin/Globulin Ratio: 1.4 (ref 1.2–2.2)
Alkaline Phosphatase: 73 IU/L (ref 39–117)
BUN / CREAT RATIO: 15 (ref 9–23)
BUN: 12 mg/dL (ref 6–24)
Bilirubin Total: 0.4 mg/dL (ref 0.0–1.2)
CALCIUM: 8.9 mg/dL (ref 8.7–10.2)
CHLORIDE: 106 mmol/L (ref 96–106)
CO2: 22 mmol/L (ref 20–29)
CREATININE: 0.8 mg/dL (ref 0.57–1.00)
GFR calc non Af Amer: 88 mL/min/{1.73_m2} (ref >59)
GFR, EST AFRICAN AMERICAN: 102 mL/min/{1.73_m2} (ref >59)
GLUCOSE: 83 mg/dL (ref 65–99)
Globulin, Total: 3 g/dL (ref 1.5–4.5)
Potassium: 4.8 mmol/L (ref 3.5–5.2)
Sodium: 141 mmol/L (ref 134–144)
TOTAL PROTEIN: 7.1 g/dL (ref 6.0–8.5)

## 2017-08-09 LAB — CBC
HEMOGLOBIN: 16 g/dL — AB (ref 11.1–15.9)
Hematocrit: 46.7 % — ABNORMAL HIGH (ref 34.0–46.6)
MCH: 32.9 pg (ref 26.6–33.0)
MCHC: 34.3 g/dL (ref 31.5–35.7)
MCV: 96 fL (ref 79–97)
PLATELETS: 288 10*3/uL (ref 150–379)
RBC: 4.86 x10E6/uL (ref 3.77–5.28)
RDW: 12.8 % (ref 12.3–15.4)
WBC: 6.7 10*3/uL (ref 3.4–10.8)

## 2017-08-09 LAB — LIPID PANEL
CHOL/HDL RATIO: 3 ratio (ref 0.0–4.4)
Cholesterol, Total: 205 mg/dL — ABNORMAL HIGH (ref 100–199)
HDL: 69 mg/dL (ref >39)
LDL CALC: 117 mg/dL — AB (ref 0–99)
TRIGLYCERIDES: 95 mg/dL (ref 0–149)
VLDL Cholesterol Cal: 19 mg/dL (ref 5–40)

## 2018-02-09 DIAGNOSIS — J0100 Acute maxillary sinusitis, unspecified: Secondary | ICD-10-CM | POA: Insufficient documentation

## 2018-02-23 IMAGING — US US PELVIS COMPLETE
1 series · 14 of 25 positions shown · non-contrast
Comparison: 10/30/2015.

CLINICAL DATA: Ovarian cyst.  Endometriosis.



[Series 1: us pelvis complete · 0.21mm/px · 14 of 50 slices shown]
[im 1/50]
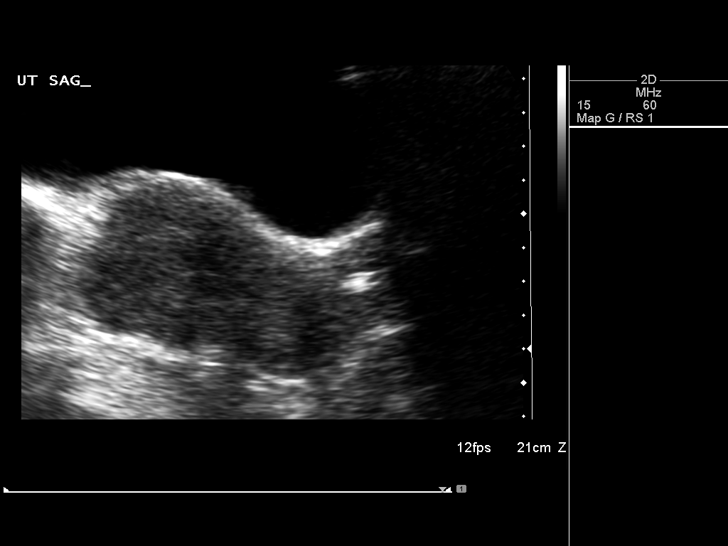
[im 5/50]
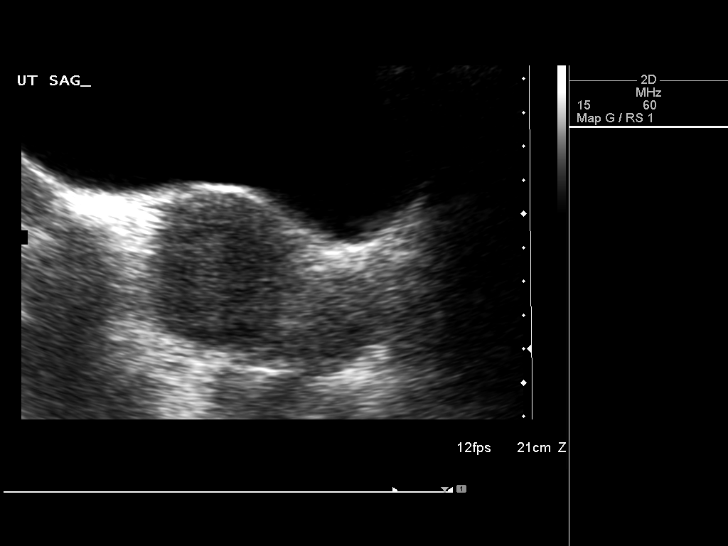
[im 9/50]
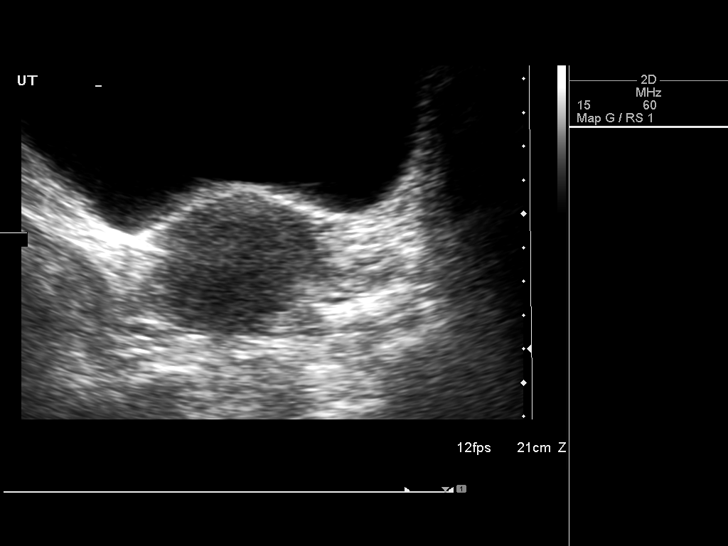
[im 13/50]
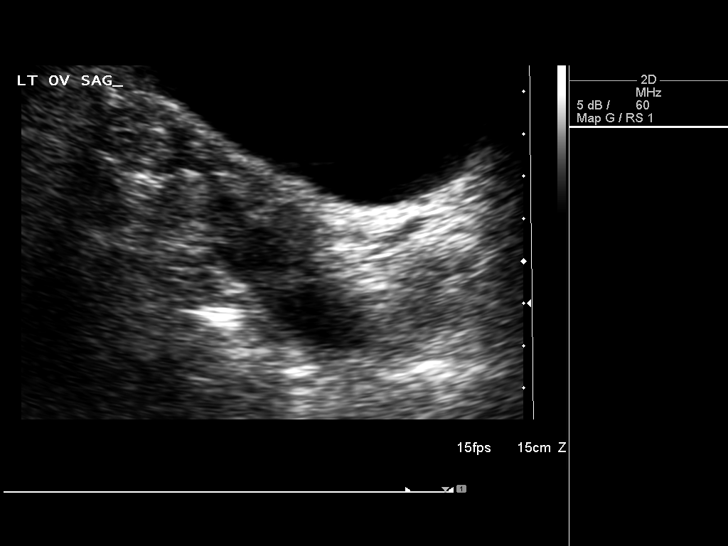
[im 17/50]
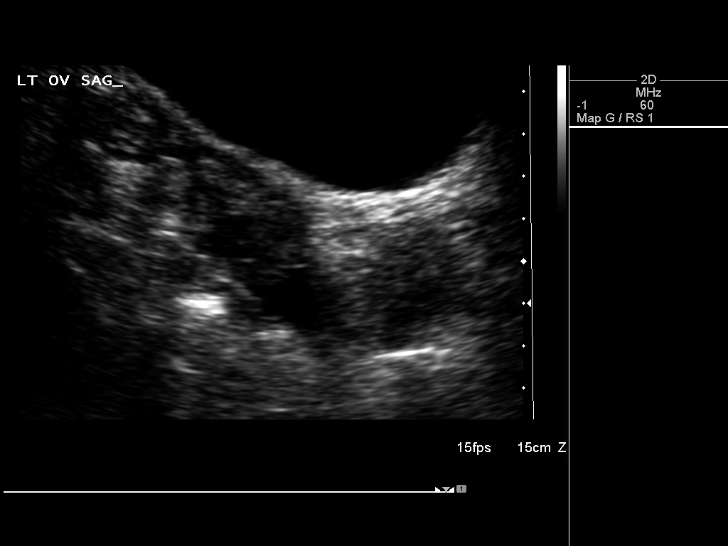
[im 19/50]
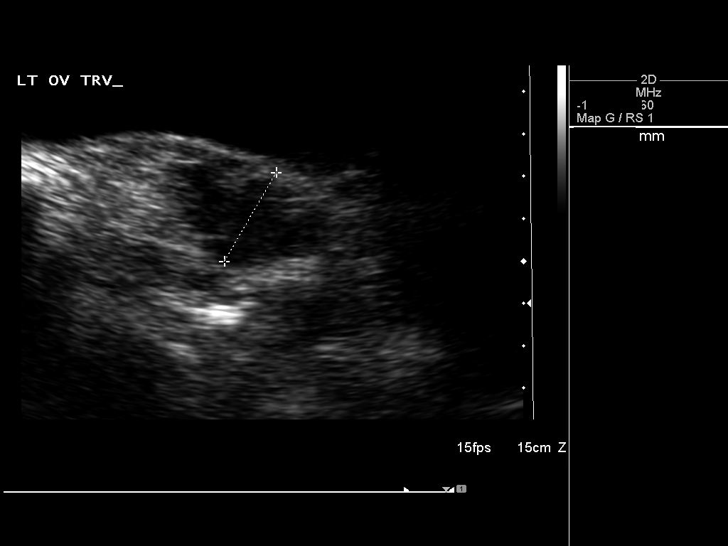
[im 23/50]
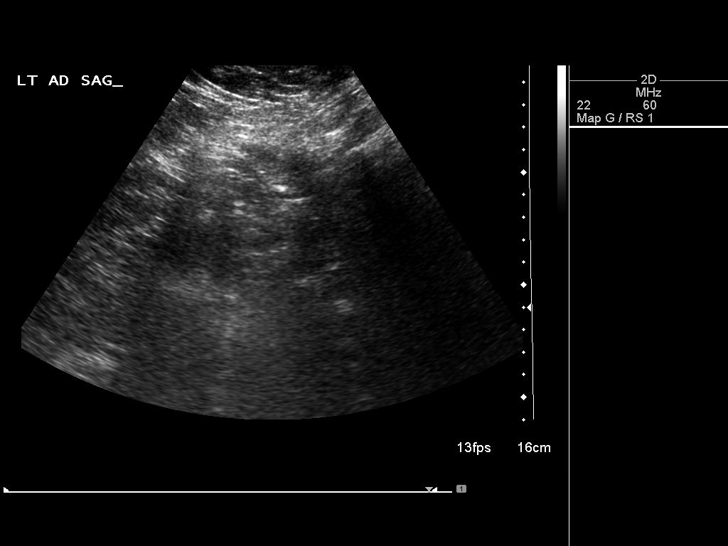
[im 27/50]
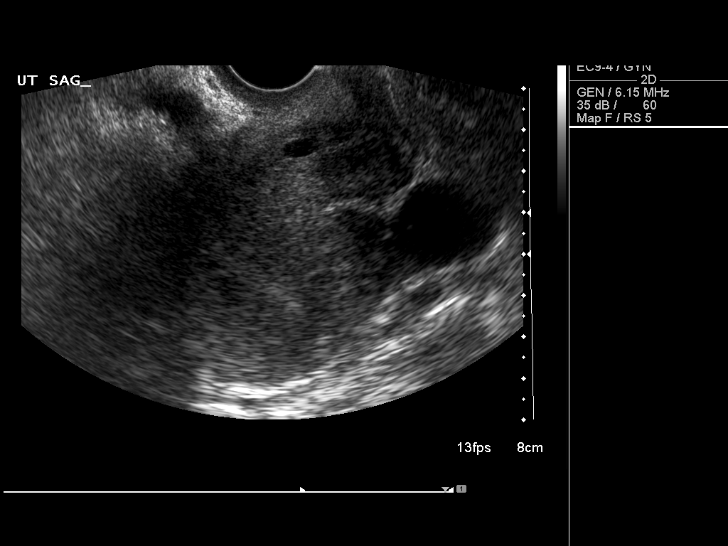
[im 31/50]
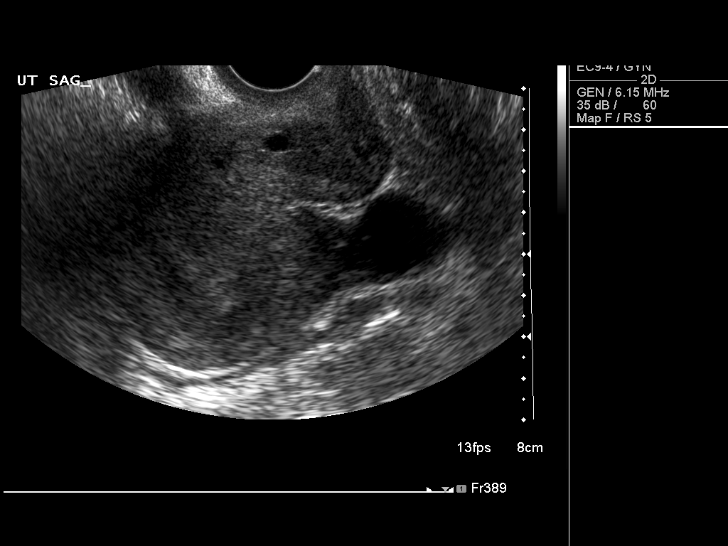
[im 33/50]
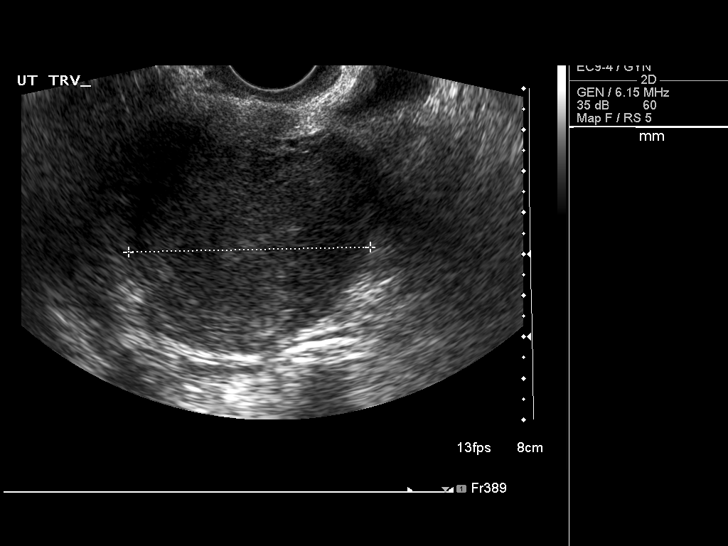
[im 37/50]
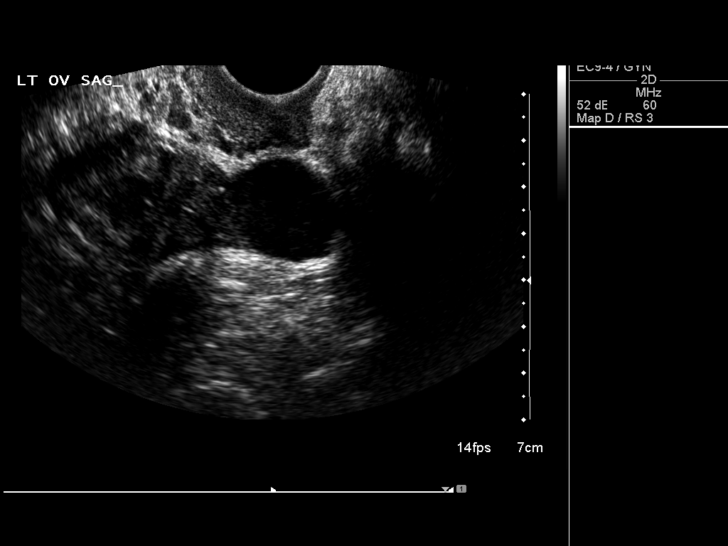
[im 41/50]
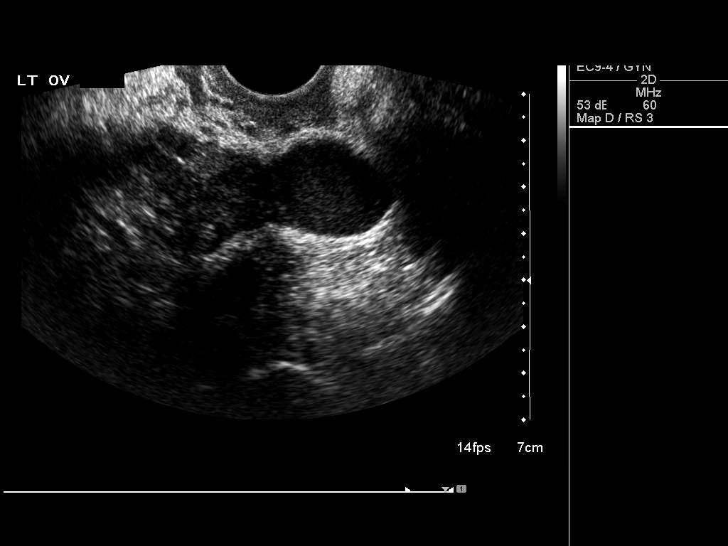
[im 45/50]
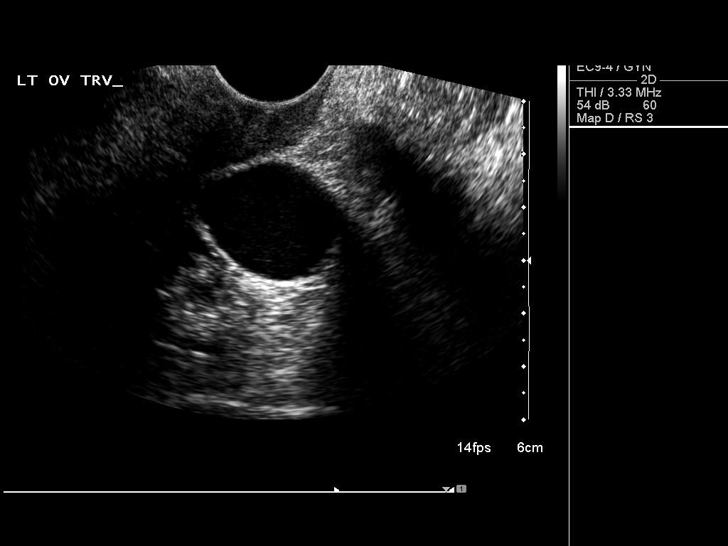
[im 50/50]
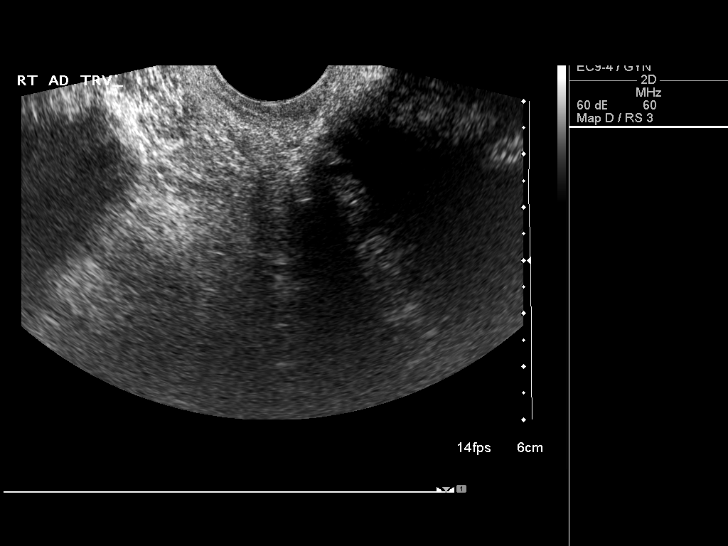

[14 of 25 positions shown; findings below may reference images not displayed]

FINDINGS: Uterus

Measurements: 8.4 x 5.0 x 5.7 cm. No fibroids or other mass
visualized.

Endometrium

Thickness: 6.7 mm.  No focal abnormality visualized.

Right ovary

Measurements: Right oophorectomy.

Left ovary

Measurements: 4.7 x 2.5 x 2.5 cm. 2.3 x 2.4 x 2.9 cm simple cyst. No
solid or complex mass noted on today's exam.

Other findings

Trace free fluid.
IMPRESSION: 1. Right oophorectomy.

2. 2.3 x 2.4 x 2.9 cm simple cyst left ovary. No solid or complex
mass noted on today's exam.

3. Trace free pelvic fluid.

## 2018-03-10 ENCOUNTER — Other Ambulatory Visit: Payer: Self-pay | Admitting: Obstetrics and Gynecology

## 2018-03-10 DIAGNOSIS — Z1231 Encounter for screening mammogram for malignant neoplasm of breast: Secondary | ICD-10-CM

## 2018-04-03 ENCOUNTER — Ambulatory Visit
Admission: RE | Admit: 2018-04-03 | Discharge: 2018-04-03 | Disposition: A | Discharge status: Home / Self Care | Payer: 59 | Source: Ambulatory Visit | Attending: Obstetrics and Gynecology | Admitting: Obstetrics and Gynecology

## 2018-04-03 DIAGNOSIS — Z1231 Encounter for screening mammogram for malignant neoplasm of breast: Secondary | ICD-10-CM

## 2018-08-28 ENCOUNTER — Other Ambulatory Visit: Payer: Self-pay

## 2018-08-28 ENCOUNTER — Encounter: Payer: Self-pay | Admitting: Obstetrics and Gynecology

## 2018-08-28 ENCOUNTER — Other Ambulatory Visit (HOSPITAL_COMMUNITY)
Admission: RE | Admit: 2018-08-28 | Discharge: 2018-08-28 | Disposition: A | Discharge status: Home / Self Care | Payer: 59 | Source: Ambulatory Visit | Attending: Obstetrics and Gynecology | Admitting: Obstetrics and Gynecology

## 2018-08-28 ENCOUNTER — Ambulatory Visit (INDEPENDENT_AMBULATORY_CARE_PROVIDER_SITE_OTHER): Payer: 59 | Admitting: Obstetrics and Gynecology

## 2018-08-28 VITALS — BP 120/76 | HR 70 | Resp 18 | Ht 64.0 in | Wt 229.4 lb

## 2018-08-28 DIAGNOSIS — Z1211 Encounter for screening for malignant neoplasm of colon: Secondary | ICD-10-CM | POA: Diagnosis not present

## 2018-08-28 DIAGNOSIS — Z01419 Encounter for gynecological examination (general) (routine) without abnormal findings: Secondary | ICD-10-CM | POA: Diagnosis not present

## 2018-08-28 DIAGNOSIS — Z113 Encounter for screening for infections with a predominantly sexual mode of transmission: Secondary | ICD-10-CM

## 2018-08-28 MED ORDER — BUPROPION HCL ER (XL) 300 MG PO TB24: 300.0000 mg | ORAL_TABLET | Freq: Every day | ORAL | 3 refills | Status: DC

## 2018-08-28 MED ORDER — ESTRADIOL 1 MG PO TABS: ORAL_TABLET | ORAL | 3 refills | Status: DC

## 2018-08-28 NOTE — Patient Instructions (Signed)

## 2018-08-28 NOTE — Progress Notes (Signed)
47 y.o. G29P1001 Divorced Caucasian female here for annual exam.    If misses a dose of Estrace, has hot flashes.   Taking Saffron for ADHD symptoms.   New partner in the last year.  He lives outside of [place].  Wants labs today.  PCP: Rolanda Lundborg, MD    Patient's last menstrual period was 02/03/2016 (exact date).           Sexually active: No.  The current method of family planning is status post hysterectomy.    Exercising: No.  The patient does not participate in regular exercise at present. Smoker:  no  Health Maintenance: Pap: 07-21-15 Neg:Neg HR HPV, 02-24-15 Neg, 07-15-14 Neg History of abnormal Pap:  Yes, CIN1 2016. 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 Pathology showed koilocytic atypia and endometriosis and ECC showed acute and chronic inflammation. Pap 10/17/13 - LGSIL and negative HR HPV, Pap 03/19/14 - Neg and meg HR HPV, Pap 07/15/14 - negative and no high risk HPV testing, Pap 01/20/15 - negative but no endocervical cells and no high risk HPV testing, Pap 02/24/15 - negative pap and no HR HPV testing done. Specimen was adequate. Hysterectomy specimen - benign cervix. MMG: 04-03-18 Neg/density B/BiRads1 Colonoscopy:  2014 diverticulitis.    BMD:   n/a  Result  n/a TDaP:  2014 Gardasil:   no HIV:in the past Hep C: not indicated Screening Labs:  Today. Flu vaccine:  Done.    reports that she has never smoked. She has never used smokeless tobacco. She reports that she drinks about 3.0 standard drinks of alcohol per week. She reports that she does not use drugs.  Past Medical History:  Diagnosis Date  . Abnormal Pap smear of cervix 2016   CIN I  . Amenorrhea   . Anemia    Hx  . Anxiety   . Cervical stenosis (uterine cervix)   . Depression   . Diverticulitis   . Endometriosis   . Infertility, female   . Seasonal allergies   . SVD (spontaneous vaginal delivery)    x 1    Past Surgical History:  Procedure Laterality Date  . ABDOMINAL HYSTERECTOMY  N/A 02/17/2016   Procedure: HYSTERECTOMY TOTAL ABDOMINAL WITH LSO, EXTENSIVE LYSIS OF ADHESION AND POSSIBLE CYSTOSCOPY;  Surgeon: Nunzio Cobbs, MD;  Location: [place];  Service: Gynecology;  Laterality: N/A;  LEFT SALPINGO OOPHORECTOMY and lysis of adhesions   . COLONOSCOPY    . CYSTOSCOPY N/A 02/17/2016   Procedure: CYSTOSCOPY;  Surgeon: Nunzio Cobbs, MD;  Location: [place];  Service: Gynecology;  Laterality: N/A;  . ENDOMETRIAL ABLATION  2003  . HYSTEROSCOPY  2003  . HYSTEROSCOPY W/D&C N/A 12/09/2015   Procedure: DILATATION AND CURETTAGE /HYSTEROSCOPY;  Surgeon: Nunzio Cobbs, MD;  Location: [place] [facility];  Service: Gynecology;  Laterality: N/A;  . OOPHORECTOMY Right 06/2003    Endometriosis   . PELVIC LAPAROSCOPY  2004   several. endometriosis   . ROBOTIC ASSISTED SALPINGO OOPHERECTOMY Left 12/09/2015   Procedure: LAPAROSCOPY WITH PELVIC WASHINGS & LYSIS OF ADHESIONS;  Surgeon: Nunzio Cobbs, MD;  Location: [facility];  Service: Gynecology;  Laterality: Left;  Dr. Quincy Simmonds called at 6:45am to tell team not to drape robot out. The team had already set the room up and draped the robot. The case was posted for a robotic salpingo oophorectomy    Current Outpatient Medications  Medication Sig Dispense Refill  . buPROPion (WELLBUTRIN XL) 300  MG 24 hr tablet Take 1 tablet (300 mg total) daily by mouth. 30 tablet 11  . estradiol (ESTRACE) 1 MG tablet Take 2 tablets ( 2 mg total) per day. 60 tablet 11  . glucosamine-chondroitin 500-400 MG tablet Take 1 tablet by mouth daily.    [name] ibuprofen (ADVIL,MOTRIN) 800 MG tablet Take 1 tablet (800 mg total) by mouth every 8 (eight) hours as needed for mild pain. 30 tablet 1  . LORazepam (ATIVAN) 0.5 MG tablet Take 0.5 mg by mouth at bedtime as needed for sleep.     . Multiple Vitamin (MULTIVITAMIN) tablet Take 1 tablet by mouth daily.    . NON FORMULARY Take 1 tablet by mouth daily. Calm-aid     No current facility-administered  medications for this visit.     Family History  Problem Relation Age of Onset  . Diabetes Maternal Uncle   . Diabetes Maternal Grandmother   . Stroke Maternal Grandmother   . Hypertension Maternal Grandmother   . Diabetes Maternal Uncle   . Lung disease Maternal Grandfather        related to working in [employer] [place]  . Breast cancer Neg Hx     Review of Systems  HENT:       Head cold  All other systems reviewed and are negative.   Exam:   BP 120/76 (BP Location: Right Arm, Patient Position: Sitting, Cuff Size: Large)   Pulse 70   Resp 18   Ht 5\' 4"  (1.626 m)   Wt 229 lb 6.4 oz (104.1 kg)   LMP 02/03/2016 (Exact Date)   BMI 39.38 kg/m     General appearance: alert, cooperative and appears stated age Head: Normocephalic, without obvious abnormality, atraumatic Neck: no adenopathy, supple, symmetrical, trachea midline and thyroid normal to inspection and palpation Lungs: clear to auscultation bilaterally Breasts: normal appearance, no masses or tenderness, No nipple retraction or dimpling, No nipple discharge or bleeding, No axillary or supraclavicular adenopathy Heart: regular rate and rhythm Abdomen: soft, non-tender; no masses, no organomegaly Extremities: extremities normal, atraumatic, no cyanosis or edema Skin: Skin color, texture, turgor normal. No rashes or lesions Lymph nodes: Cervical, supraclavicular, and axillary nodes normal. No abnormal inguinal nodes palpated Neurologic: Grossly normal  Pelvic: External genitalia:  no lesions              Urethra:  normal appearing urethra with no masses, tenderness or lesions              Bartholins and Skenes: normal                 Vagina: normal appearing vagina with normal color and discharge, no lesions              Cervix: absent              Pap taken: No. Bimanual Exam:  Uterus:   absent              Adnexa: no mass, fullness, tenderness              Rectal exam: Yes.  .  Confirms.              Anus:  normal  sphincter tone, no lesions  Chaperone was present for exam.  Assessment:   Well woman visit with normal exam. Status post TAH/leftsalpingo-oophorectomy.  Status post prior rightsalpingo-oophorectomy. Endometriosis. ERT. Depression.   Plan: Mammogram screening. Recommended self breast awareness. Pap and HR HPV as above. Guidelines for Calcium,  Vitamin D, regular exercise program including cardiovascular and weight bearing exercise. Refill of Estrace 2 mg daily for one year.  I discussed WHI and risks of stroke, DVT, and PE. Refill of Wellbutrin XL 300 mg daily for one year.  Routine labs, STD screening, IFOB.  Follow up annually and prn.    After visit summary provided.

## 2018-08-29 LAB — HEP, RPR, HIV PANEL
HIV Screen 4th Generation wRfx: NONREACTIVE
Hepatitis B Surface Ag: NEGATIVE
RPR Ser Ql: NONREACTIVE

## 2018-08-29 LAB — COMPREHENSIVE METABOLIC PANEL
ALK PHOS: 72 IU/L (ref 39–117)
ALT: 26 IU/L (ref 0–32)
AST: 28 IU/L (ref 0–40)
Albumin/Globulin Ratio: 1.4 (ref 1.2–2.2)
Albumin: 3.9 g/dL (ref 3.5–5.5)
BUN/Creatinine Ratio: 12 (ref 9–23)
BUN: 11 mg/dL (ref 6–24)
Bilirubin Total: 0.2 mg/dL (ref 0.0–1.2)
CO2: 24 mmol/L (ref 20–29)
Calcium: 9.1 mg/dL (ref 8.7–10.2)
Chloride: 106 mmol/L (ref 96–106)
Creatinine, Ser: 0.9 mg/dL (ref 0.57–1.00)
GFR calc Af Amer: 87 mL/min/{1.73_m2} (ref >59)
GFR calc non Af Amer: 76 mL/min/{1.73_m2} (ref >59)
Globulin, Total: 2.7 g/dL (ref 1.5–4.5)
Glucose: 71 mg/dL (ref 65–99)
Potassium: 4.9 mmol/L (ref 3.5–5.2)
Sodium: 143 mmol/L (ref 134–144)
Total Protein: 6.6 g/dL (ref 6.0–8.5)

## 2018-08-29 LAB — CBC
Hematocrit: 44.3 % (ref 34.0–46.6)
Hemoglobin: 15.5 g/dL (ref 11.1–15.9)
MCH: 32.9 pg (ref 26.6–33.0)
MCHC: 35 g/dL (ref 31.5–35.7)
MCV: 94 fL (ref 79–97)
Platelets: 298 10*3/uL (ref 150–450)
RBC: 4.71 x10E6/uL (ref 3.77–5.28)
RDW: 11.8 % — ABNORMAL LOW (ref 12.3–15.4)
WBC: 6.1 10*3/uL (ref 3.4–10.8)

## 2018-08-29 LAB — LIPID PANEL
Chol/HDL Ratio: 3 ratio (ref 0.0–4.4)
Cholesterol, Total: 205 mg/dL — ABNORMAL HIGH (ref 100–199)
HDL: 69 mg/dL (ref >39)
LDL Calculated: 119 mg/dL — ABNORMAL HIGH (ref 0–99)
Triglycerides: 86 mg/dL (ref 0–149)
VLDL Cholesterol Cal: 17 mg/dL (ref 5–40)

## 2018-08-29 LAB — TSH: TSH: 3.92 u[IU]/mL (ref 0.450–4.500)

## 2018-08-29 LAB — HEPATITIS C ANTIBODY: Hep C Virus Ab: <0.1 s/co ratio (ref 0.0–0.9)

## 2018-08-29 LAB — CERVICOVAGINAL ANCILLARY ONLY
Chlamydia: NEGATIVE
Neisseria Gonorrhea: NEGATIVE
Trichomonas: NEGATIVE

## 2018-09-17 LAB — FECAL OCCULT BLOOD, IMMUNOCHEMICAL: Fecal Occult Bld: NEGATIVE

## 2019-04-27 ENCOUNTER — Other Ambulatory Visit: Payer: Self-pay | Admitting: Obstetrics and Gynecology

## 2019-04-27 DIAGNOSIS — Z1231 Encounter for screening mammogram for malignant neoplasm of breast: Secondary | ICD-10-CM

## 2019-05-21 ENCOUNTER — Other Ambulatory Visit: Payer: Self-pay

## 2019-05-21 ENCOUNTER — Ambulatory Visit
Admission: RE | Admit: 2019-05-21 | Discharge: 2019-05-21 | Disposition: A | Discharge status: Home / Self Care | Payer: 59 | Source: Ambulatory Visit

## 2019-05-21 DIAGNOSIS — Z1231 Encounter for screening mammogram for malignant neoplasm of breast: Secondary | ICD-10-CM

## 2019-09-03 ENCOUNTER — Encounter: Payer: Self-pay | Admitting: Obstetrics and Gynecology

## 2019-09-03 ENCOUNTER — Ambulatory Visit: Payer: 59 | Admitting: Obstetrics and Gynecology

## 2019-09-03 ENCOUNTER — Other Ambulatory Visit: Payer: Self-pay

## 2019-09-03 VITALS — BP 118/74 | HR 80 | Temp 97.3°F | Resp 16 | Ht 64.0 in | Wt 240.0 lb

## 2019-09-03 DIAGNOSIS — Z01419 Encounter for gynecological examination (general) (routine) without abnormal findings: Secondary | ICD-10-CM | POA: Diagnosis not present

## 2019-09-03 MED ORDER — ESTRADIOL 1 MG PO TABS: ORAL_TABLET | ORAL | 11 refills | Status: DC

## 2019-09-03 MED ORDER — BUPROPION HCL ER (XL) 300 MG PO TB24: 300.0000 mg | ORAL_TABLET | Freq: Every day | ORAL | 11 refills | Status: DC

## 2019-09-03 NOTE — Patient Instructions (Signed)

## 2019-09-03 NOTE — Progress Notes (Signed)
48 y.o. G4P1001 Divorced Caucasian female here for annual exam.    Taking her estrogen.  If she forgets, she has hot flashes.  Wants to continue.   Taking Wellbutrin XL and wants to continue.  Not sexually active.  Declines STD screening.   Mother dx with breast cancer.  She is in [place].    PCP: Rolanda Lundborg, MD   Patient's last menstrual period was 02/03/2016 (exact date).           Sexually active: No.  The current method of family planning is status post hysterectomy.    Exercising: No.  The patient does not participate in regular exercise at present. Smoker:  no  Health Maintenance: Pap: 07-21-15 Neg:Neg HR HPV, 02-24-15 Neg, 07-15-14 Neg History of abnormal Pap:  Yes, CIN1 2016. 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 Pathology showed koilocytic atypia and endometriosis and ECC showed acute and chronic inflammation. Pap 10/17/13 - LGSIL and negative HR HPV, Pap 03/19/14 - Neg and meg HR HPV, Pap 07/15/14 - negative and no high risk HPV testing, Pap 01/20/15 - negative but no endocervical cells and no high risk HPV testing, Pap 02/24/15 - negative pap and no HR HPV testing done. Specimen was adequate. Hysterectomy specimen - benign cervix. MMG: 05-21-19 Neg/density B/BiRads1 Colonoscopy: 2014 diverticulitis;next 10 years BMD:   n/a  Result  n/a TDaP:  2014 Gardasil:   no HIV: 08-28-18 NR Hep C:08-28-18 Neg Screening Labs:  Today Flu vaccine:  Recommended.    reports that she has never smoked. She has never used smokeless tobacco. She reports current alcohol use of about 3.0 standard drinks of alcohol per week. She reports that she does not use drugs.  Past Medical History:  Diagnosis Date  . Abnormal Pap smear of cervix 2016   CIN I  . Amenorrhea   . Anemia    Hx  . Anxiety   . Cervical stenosis (uterine cervix)   . Depression   . Diverticulitis   . Endometriosis   . Infertility, female   . Seasonal allergies   . SVD (spontaneous vaginal delivery)    x  1    Past Surgical History:  Procedure Laterality Date  . ABDOMINAL HYSTERECTOMY N/A 02/17/2016   Procedure: HYSTERECTOMY TOTAL ABDOMINAL WITH LSO, EXTENSIVE LYSIS OF ADHESION AND POSSIBLE CYSTOSCOPY;  Surgeon: Nunzio Cobbs, MD;  Location: [facility];  Service: Gynecology;  Laterality: N/A;  LEFT SALPINGO OOPHORECTOMY and lysis of adhesions   . COLONOSCOPY    . CYSTOSCOPY N/A 02/17/2016   Procedure: CYSTOSCOPY;  Surgeon: Nunzio Cobbs, MD;  Location: [facility];  Service: Gynecology;  Laterality: N/A;  . ENDOMETRIAL ABLATION  2003  . HYSTEROSCOPY  2003  . HYSTEROSCOPY W/D&C N/A 12/09/2015   Procedure: DILATATION AND CURETTAGE /HYSTEROSCOPY;  Surgeon: Nunzio Cobbs, MD;  Location: [facility];  Service: Gynecology;  Laterality: N/A;  . OOPHORECTOMY Right 06/2003    Endometriosis   . PELVIC LAPAROSCOPY  2004   several. endometriosis   . ROBOTIC ASSISTED SALPINGO OOPHERECTOMY Left 12/09/2015   Procedure: LAPAROSCOPY WITH PELVIC WASHINGS & LYSIS OF ADHESIONS;  Surgeon: Nunzio Cobbs, MD;  Location: [facility];  Service: Gynecology;  Laterality: Left;  Dr. Quincy Simmonds called at 6:45am to tell team not to drape robot out. The team had already set the room up and draped the robot. The case was posted for a robotic salpingo oophorectomy    Current Outpatient Medications  Medication  Sig Dispense Refill  . buPROPion (WELLBUTRIN XL) 300 MG 24 hr tablet Take 1 tablet (300 mg total) by mouth daily. 90 tablet 3  . estradiol (ESTRACE) 1 MG tablet Take 2 tablets ( 2 mg total) per day. 180 tablet 3  . glucosamine-chondroitin 500-400 MG tablet Take 1 tablet by mouth daily.    [name] ibuprofen (ADVIL,MOTRIN) 800 MG tablet Take 1 tablet (800 mg total) by mouth every 8 (eight) hours as needed for mild pain. 30 tablet 1  . LORazepam (ATIVAN) 0.5 MG tablet Take 0.5 mg by mouth at bedtime as needed for sleep.     . Multiple Vitamin (MULTIVITAMIN) tablet Take 1 tablet by mouth daily.    . NON  FORMULARY Take 1 tablet by mouth daily. Calm-aid     No current facility-administered medications for this visit.    Family History  Problem Relation Age of Onset  . Breast cancer Mother 74  . Diabetes Maternal Uncle   . Diabetes Maternal Grandmother   . Stroke Maternal Grandmother   . Hypertension Maternal Grandmother   . Diabetes Maternal Uncle   . Lung disease Maternal Grandfather        related to working in saw mill    Review of Systems  Exam:   BP 118/74 (Cuff Size: Large)   Pulse 80   Temp (!) 97.3 F (36.3 C) (Temporal)   Resp 16   Ht 5\' 4"  (1.626 m)   Wt 240 lb (108.9 kg)   LMP 02/03/2016 (Exact Date)   BMI 41.20 kg/m     General appearance: alert, cooperative and appears stated age Head: normocephalic, without obvious abnormality, atraumatic Neck: no adenopathy, supple, symmetrical, trachea midline and thyroid normal to inspection and palpation Lungs: clear to auscultation bilaterally Breasts: normal appearance, no masses or tenderness, No nipple retraction or dimpling, No nipple discharge or bleeding, No axillary adenopathy Heart: regular rate and rhythm Abdomen: soft, non-tender; no masses, no organomegaly Extremities: extremities normal, atraumatic, no cyanosis or edema Skin: skin color, texture, turgor normal. No rashes or lesions Lymph nodes: cervical, supraclavicular, and axillary nodes normal. Neurologic: grossly normal  Pelvic: External genitalia:  no lesions              No abnormal inguinal nodes palpated.              Urethra:  normal appearing urethra with no masses, tenderness or lesions              Bartholins and Skenes: normal                 Vagina: normal appearing vagina with normal color and discharge, no lesions              Cervix: absent              Pap taken: No. Bimanual Exam:  Uterus:  absent              Adnexa: no mass, fullness, tenderness              Rectal exam: Yes.  .  Confirms.              Anus:  normal sphincter tone,  no lesions  Chaperone was present for exam.  Assessment:   Well woman visit with normal exam. Status post TAH/leftsalpingo-oophorectomy.  Status post prior rightsalpingo-oophorectomy. Hx endometriosis. ERT. Depression. controlled with Wellbutrin XL.  FH breast cancer in mother.   Plan: Mammogram screening discussed.  We discussed  3D. Self breast awareness reviewed. Pap and HR HPV as above. Guidelines for Calcium, Vitamin D, regular exercise program including cardiovascular and weight bearing exercise. Estrace 2 mg daily. Rx for one year.  Wellbutrin XL 300 mg daily.  Rx for one year.  Routine labs.  Follow up annually and prn.   After visit summary provided.

## 2019-09-04 LAB — CBC
Hematocrit: 45 % (ref 34.0–46.6)
Hemoglobin: 15.8 g/dL (ref 11.1–15.9)
MCH: 33.5 pg — ABNORMAL HIGH (ref 26.6–33.0)
MCHC: 35.1 g/dL (ref 31.5–35.7)
MCV: 96 fL (ref 79–97)
Platelets: 293 10*3/uL (ref 150–450)
RBC: 4.71 x10E6/uL (ref 3.77–5.28)
RDW: 11.9 % (ref 11.7–15.4)
WBC: 5.6 10*3/uL (ref 3.4–10.8)

## 2019-09-04 LAB — COMPREHENSIVE METABOLIC PANEL
ALT: 28 IU/L (ref 0–32)
AST: 25 IU/L (ref 0–40)
Albumin/Globulin Ratio: 1.4 (ref 1.2–2.2)
Albumin: 4 g/dL (ref 3.8–4.8)
Alkaline Phosphatase: 90 IU/L (ref 39–117)
BUN/Creatinine Ratio: 14 (ref 9–23)
BUN: 12 mg/dL (ref 6–24)
Bilirubin Total: 0.3 mg/dL (ref 0.0–1.2)
CO2: 26 mmol/L (ref 20–29)
Calcium: 8.9 mg/dL (ref 8.7–10.2)
Chloride: 107 mmol/L — ABNORMAL HIGH (ref 96–106)
Creatinine, Ser: 0.84 mg/dL (ref 0.57–1.00)
GFR calc Af Amer: 94 mL/min/{1.73_m2} (ref >59)
GFR calc non Af Amer: 82 mL/min/{1.73_m2} (ref >59)
Globulin, Total: 2.8 g/dL (ref 1.5–4.5)
Glucose: 81 mg/dL (ref 65–99)
Potassium: 4.5 mmol/L (ref 3.5–5.2)
Sodium: 145 mmol/L — ABNORMAL HIGH (ref 134–144)
Total Protein: 6.8 g/dL (ref 6.0–8.5)

## 2019-09-04 LAB — VITAMIN D 25 HYDROXY (VIT D DEFICIENCY, FRACTURES): Vit D, 25-Hydroxy: 36.4 ng/mL (ref 30.0–100.0)

## 2019-09-04 LAB — LIPID PANEL
Chol/HDL Ratio: 3.2 ratio (ref 0.0–4.4)
Cholesterol, Total: 221 mg/dL — ABNORMAL HIGH (ref 100–199)
HDL: 70 mg/dL (ref >39)
LDL Chol Calc (NIH): 133 mg/dL — ABNORMAL HIGH (ref 0–99)
Triglycerides: 103 mg/dL (ref 0–149)
VLDL Cholesterol Cal: 18 mg/dL (ref 5–40)

## 2019-09-04 LAB — TSH: TSH: 3.78 u[IU]/mL (ref 0.450–4.500)

## 2020-05-14 ENCOUNTER — Other Ambulatory Visit: Payer: Self-pay | Admitting: Obstetrics and Gynecology

## 2020-05-14 DIAGNOSIS — Z1231 Encounter for screening mammogram for malignant neoplasm of breast: Secondary | ICD-10-CM

## 2020-06-17 ENCOUNTER — Other Ambulatory Visit: Payer: Self-pay | Admitting: Obstetrics and Gynecology

## 2020-06-17 DIAGNOSIS — Z1231 Encounter for screening mammogram for malignant neoplasm of breast: Secondary | ICD-10-CM

## 2020-06-30 ENCOUNTER — Other Ambulatory Visit: Payer: Self-pay

## 2020-06-30 ENCOUNTER — Ambulatory Visit
Admission: RE | Admit: 2020-06-30 | Discharge: 2020-06-30 | Disposition: A | Discharge status: Home / Self Care | Payer: 59 | Source: Ambulatory Visit

## 2020-06-30 DIAGNOSIS — Z1231 Encounter for screening mammogram for malignant neoplasm of breast: Secondary | ICD-10-CM

## 2020-09-02 NOTE — Progress Notes (Signed)
49 y.o. G38P1001 Divorced Caucasian female here for annual exam.    Doing well on Wellbutrin XL.  She notices that she feels a difference when she misses a dosage.  Her PCP prescribes Ativan.   If misses a dose of estrogen, she has hot flashes.  Wants to continue.   Doing telehealth and in office visits.   Completed her booster vaccine for Covid.  No flu vaccine.   PCP: Rolanda Lundborg, MD    Patient's last menstrual period was 02/03/2016 (exact date).           Sexually active: No.  The current method of family planning is status post hysterectomy.    Exercising: No.  The patient does not participate in regular exercise at present. Smoker:  no  Health Maintenance: Pap: 07-21-15 Neg:Neg HR HPV, 02-24-15 Neg, 07-15-14 Neg History of abnormal Pap:  Yes, CIN1 2016. 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 Pathology showed koilocytic atypia and endometriosis and ECC showed acute and chronic inflammation. Pap 10/17/13 - LGSIL and negative HR HPV, Pap 03/19/14 - Neg and meg HR HPV, Pap 07/15/14 - negative and no high risk HPV testing, Pap 01/20/15 - negative but no endocervical cells and no high risk HPV testing, Pap 02/24/15 - negative pap and no HR HPV testing done. Specimen was adequate.  Hysterectomy 2017 with final pathology - benign cervix. MMG: 06-30-20 3D/Neg/density B/birads1 Colonoscopy: 2014 diverticulitis;next 10 years BMD: n/a  Result  n/a TDaP: 2014 Gardasil:   no HIV:08-28-18 NR Hep C: 19-9-19 Neg Screening Labs:  Today.    reports that she has never smoked. She has never used smokeless tobacco. She reports current alcohol use of about 3.0 standard drinks of alcohol per week. She reports that she does not use drugs.  Past Medical History:  Diagnosis Date  . Abnormal Pap smear of cervix 2016   CIN I  . Amenorrhea   . Anemia    Hx  . Anxiety   . Cervical stenosis (uterine cervix)   . Depression   . Diverticulitis   . Endometriosis   . Infertility, female   .  Seasonal allergies   . SVD (spontaneous vaginal delivery)    x 1    Past Surgical History:  Procedure Laterality Date  . ABDOMINAL HYSTERECTOMY N/A 02/17/2016   Procedure: HYSTERECTOMY TOTAL ABDOMINAL WITH LSO, EXTENSIVE LYSIS OF ADHESION AND POSSIBLE CYSTOSCOPY;  Surgeon: Nunzio Cobbs, MD;  Location: [facility];  Service: Gynecology;  Laterality: N/A;  LEFT SALPINGO OOPHORECTOMY and lysis of adhesions   . COLONOSCOPY    . CYSTOSCOPY N/A 02/17/2016   Procedure: CYSTOSCOPY;  Surgeon: Nunzio Cobbs, MD;  Location: [facility];  Service: Gynecology;  Laterality: N/A;  . ENDOMETRIAL ABLATION  2003  . HYSTEROSCOPY  2003  . HYSTEROSCOPY WITH D & C N/A 12/09/2015   Procedure: DILATATION AND CURETTAGE /HYSTEROSCOPY;  Surgeon: Nunzio Cobbs, MD;  Location: [facility];  Service: Gynecology;  Laterality: N/A;  . OOPHORECTOMY Right 06/2003    Endometriosis   . PELVIC LAPAROSCOPY  2004   several. endometriosis   . ROBOTIC ASSISTED SALPINGO OOPHERECTOMY Left 12/09/2015   Procedure: LAPAROSCOPY WITH PELVIC WASHINGS & LYSIS OF ADHESIONS;  Surgeon: Nunzio Cobbs, MD;  Location: [facility];  Service: Gynecology;  Laterality: Left;  Dr. Quincy Simmonds called at 6:45am to tell team not to drape robot out. The team had already set the room up and draped the robot. The case  was posted for a robotic salpingo oophorectomy    Current Outpatient Medications  Medication Sig Dispense Refill  . buPROPion (WELLBUTRIN XL) 300 MG 24 hr tablet Take 1 tablet (300 mg total) by mouth daily. 30 tablet 11  . estradiol (ESTRACE) 1 MG tablet Take 2 tablets ( 2 mg total) per day. 60 tablet 11  . glucosamine-chondroitin 500-400 MG tablet Take 1 tablet by mouth daily.    [name] ibuprofen (ADVIL,MOTRIN) 800 MG tablet Take 1 tablet (800 mg total) by mouth every 8 (eight) hours as needed for mild pain. 30 tablet 1  . LORazepam (ATIVAN) 0.5 MG tablet Take 0.5 mg by mouth at bedtime as needed for sleep.     . Multiple  Vitamin (MULTIVITAMIN) tablet Take 1 tablet by mouth daily.    . NON FORMULARY Take 1 tablet by mouth daily. Calm-aid     No current facility-administered medications for this visit.    Family History  Problem Relation Age of Onset  . Breast cancer Mother 2  . Diabetes Maternal Uncle   . Diabetes Maternal Grandmother   . Stroke Maternal Grandmother   . Hypertension Maternal Grandmother   . Diabetes Maternal Uncle   . Lung disease Maternal Grandfather        related to working in saw mill    Review of Systems  All other systems reviewed and are negative.   Exam:   BP 128/84   Pulse 80   Ht 5\' 4"  (1.626 m)   Wt 235 lb (106.6 kg)   LMP 02/03/2016 (Exact Date)   SpO2 97%   BMI 40.34 kg/m     General appearance: alert, cooperative and appears stated age Head: normocephalic, without obvious abnormality, atraumatic Neck: no adenopathy, supple, symmetrical, trachea midline and thyroid normal to inspection and palpation Lungs: clear to auscultation bilaterally Breasts: normal appearance, no masses or tenderness, No nipple retraction or dimpling, No nipple discharge or bleeding, No axillary adenopathy Heart: regular rate and rhythm Abdomen: soft, non-tender; no masses, no organomegaly Extremities: extremities normal, atraumatic, no cyanosis or edema Skin: skin color, texture, turgor normal. No rashes or lesions Lymph nodes: cervical, supraclavicular, and axillary nodes normal. Neurologic: grossly normal  Pelvic: External genitalia:  no lesions              No abnormal inguinal nodes palpated.              Urethra:  normal appearing urethra with no masses, tenderness or lesions              Bartholins and Skenes: normal                 Vagina: normal appearing vagina with normal color and discharge, no lesions              Cervix: absent              Pap taken: No. Bimanual Exam:  Uterus:  absent              Adnexa: no mass, fullness, tenderness              Rectal exam:  Yes.  .  Confirms.              Anus:  normal sphincter tone, no lesions  Chaperone was present for exam.  Assessment:   Well woman visit with normal exam. Status post TAH/leftsalpingo-oophorectomy.  Status post prior rightsalpingo-oophorectomy. Hx endometriosis. ERT. Depression. Controlled with Wellbutrin XL.  FH breast  cancer in mother.   Plan: Mammogram screening discussed. Self breast awareness reviewed. Pap and HR HPV as above. Guidelines for Calcium, Vitamin D, regular exercise program including cardiovascular and weight bearing exercise. Refill of ERT.  I discussed potential risk of thromboembolic events.  Refill of Wellbutrin XL 300 mg daily.  Routine labs.  Follow up annually and prn.   After visit summary provided.

## 2020-09-03 ENCOUNTER — Encounter: Payer: Self-pay | Admitting: Obstetrics and Gynecology

## 2020-09-03 ENCOUNTER — Other Ambulatory Visit: Payer: Self-pay

## 2020-09-03 ENCOUNTER — Ambulatory Visit: Payer: BC Managed Care – PPO | Admitting: Obstetrics and Gynecology

## 2020-09-03 VITALS — BP 128/84 | HR 80 | Ht 64.0 in | Wt 235.0 lb

## 2020-09-03 DIAGNOSIS — Z01419 Encounter for gynecological examination (general) (routine) without abnormal findings: Secondary | ICD-10-CM

## 2020-09-03 MED ORDER — BUPROPION HCL ER (XL) 300 MG PO TB24
300.0000 mg | ORAL_TABLET | Freq: Every day | ORAL | 11 refills | Status: DC
Start: 2020-09-03 — End: 2021-09-28

## 2020-09-03 MED ORDER — ESTRADIOL 1 MG PO TABS
ORAL_TABLET | ORAL | 11 refills | Status: DC
Start: 2020-09-03 — End: 2021-09-28

## 2020-09-03 NOTE — Patient Instructions (Signed)

## 2020-09-04 LAB — LIPID PANEL
Chol/HDL Ratio: 3.2 ratio (ref 0.0–4.4)
Cholesterol, Total: 215 mg/dL — ABNORMAL HIGH (ref 100–199)
HDL: 67 mg/dL (ref >39)
LDL Chol Calc (NIH): 130 mg/dL — ABNORMAL HIGH (ref 0–99)
Triglycerides: 102 mg/dL (ref 0–149)
VLDL Cholesterol Cal: 18 mg/dL (ref 5–40)

## 2020-09-04 LAB — CBC
Hematocrit: 47.7 % — ABNORMAL HIGH (ref 34.0–46.6)
Hemoglobin: 16.5 g/dL — ABNORMAL HIGH (ref 11.1–15.9)
MCH: 32.9 pg (ref 26.6–33.0)
MCHC: 34.6 g/dL (ref 31.5–35.7)
MCV: 95 fL (ref 79–97)
Platelets: 311 10*3/uL (ref 150–450)
RBC: 5.01 x10E6/uL (ref 3.77–5.28)
RDW: 12.3 % (ref 11.7–15.4)
WBC: 6 10*3/uL (ref 3.4–10.8)

## 2020-09-04 LAB — COMPREHENSIVE METABOLIC PANEL
ALT: 22 IU/L (ref 0–32)
AST: 24 IU/L (ref 0–40)
Albumin/Globulin Ratio: 1.3 (ref 1.2–2.2)
Albumin: 4 g/dL (ref 3.8–4.8)
Alkaline Phosphatase: 82 IU/L (ref 44–121)
BUN/Creatinine Ratio: 10 (ref 9–23)
BUN: 8 mg/dL (ref 6–24)
Bilirubin Total: 0.3 mg/dL (ref 0.0–1.2)
CO2: 24 mmol/L (ref 20–29)
Calcium: 9 mg/dL (ref 8.7–10.2)
Chloride: 104 mmol/L (ref 96–106)
Creatinine, Ser: 0.82 mg/dL (ref 0.57–1.00)
GFR calc Af Amer: 96 mL/min/{1.73_m2} (ref >59)
GFR calc non Af Amer: 84 mL/min/{1.73_m2} (ref >59)
Globulin, Total: 3.2 g/dL (ref 1.5–4.5)
Glucose: 72 mg/dL (ref 65–99)
Potassium: 4.2 mmol/L (ref 3.5–5.2)
Sodium: 139 mmol/L (ref 134–144)
Total Protein: 7.2 g/dL (ref 6.0–8.5)

## 2020-09-04 LAB — VITAMIN D 25 HYDROXY (VIT D DEFICIENCY, FRACTURES): Vit D, 25-Hydroxy: 48.2 ng/mL (ref 30.0–100.0)

## 2021-06-25 ENCOUNTER — Other Ambulatory Visit: Payer: Self-pay | Admitting: Obstetrics and Gynecology

## 2021-06-25 DIAGNOSIS — Z1231 Encounter for screening mammogram for malignant neoplasm of breast: Secondary | ICD-10-CM

## 2021-07-27 ENCOUNTER — Other Ambulatory Visit: Payer: Self-pay

## 2021-07-27 ENCOUNTER — Ambulatory Visit
Admission: RE | Admit: 2021-07-27 | Discharge: 2021-07-27 | Disposition: A | Discharge status: Home / Self Care | Payer: 59 | Source: Ambulatory Visit

## 2021-07-27 DIAGNOSIS — Z1231 Encounter for screening mammogram for malignant neoplasm of breast: Secondary | ICD-10-CM

## 2021-09-24 NOTE — Progress Notes (Signed)
51 y.o. G34P1001 Divorced Caucasian female here for annual exam.    Her 71 year old son passed away suddenly in August 23, 2023 from a potential liver illness. Patient is starting bereavement counseling this month.  Has good support including her colleagues.   Restarted her Wellbutrin and Estradiol.   She is taking Ativan to help her sleep.   PCP: Rolanda Lundborg, MD  Patient's last menstrual period was 02/03/2016 (exact date).           Sexually active: No.  The current method of family planning is status post hysterectomy.    Exercising: No.  The patient does not participate in regular exercise at present. Smoker:  no  Health Maintenance: Pap: 07-21-15 Neg:Neg HR HPV, 02-24-15 Neg, 07-15-14 Neg History of abnormal Pap:  Yes,   CIN1 2016. 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 Pathology showed koilocytic atypia and endometriosis and ECC showed acute and chronic inflammation. Pap 10/17/13 - LGSIL and negative HR HPV, Pap 03/19/14 - Neg and meg HR HPV, Pap 07/15/14 - negative and no high risk HPV testing, Pap 01/20/15 - negative but no endocervical cells and no high risk HPV testing, Pap 02/24/15 - negative pap and no HR HPV testing done.  Specimen was adequate.   Hysterectomy 2017 with final pathology - benign cervix. MMG:  07-27-21 Neg/Birads1 Colonoscopy:  2014;next 10 years BMD:   n/a  Result  n/a TDaP:  2014 Gardasil:   no HIV: 08-28-18 NR Hep C: 08-28-18 Neg Screening Labs:  today. Flu vaccine:  completed.  Covid booster:  this fall.    reports that she has never smoked. She has never used smokeless tobacco. She reports that she does not currently use alcohol. She reports that she does not use drugs.  Past Medical History:  Diagnosis Date   Abnormal Pap smear of cervix 2016   CIN I   Amenorrhea    Anemia    Hx   Anxiety    Cervical stenosis (uterine cervix)    Depression    Diverticulitis    Endometriosis    Infertility, female    Seasonal allergies    SVD (spontaneous  vaginal delivery)    x 1    Past Surgical History:  Procedure Laterality Date   ABDOMINAL HYSTERECTOMY N/A 02/17/2016   Procedure: HYSTERECTOMY TOTAL ABDOMINAL WITH LSO, EXTENSIVE LYSIS OF ADHESION AND POSSIBLE CYSTOSCOPY;  Surgeon: Nunzio Cobbs, MD;  Location: [facility];  Service: Gynecology;  Laterality: N/A;  LEFT SALPINGO OOPHORECTOMY and lysis of adhesions    COLONOSCOPY     CYSTOSCOPY N/A 02/17/2016   Procedure: CYSTOSCOPY;  Surgeon: Nunzio Cobbs, MD;  Location: [facility];  Service: Gynecology;  Laterality: N/A;   ENDOMETRIAL ABLATION  2003   HYSTEROSCOPY  2003   HYSTEROSCOPY WITH D & C N/A 12/09/2015   Procedure: DILATATION AND CURETTAGE /HYSTEROSCOPY;  Surgeon: Nunzio Cobbs, MD;  Location: [facility];  Service: Gynecology;  Laterality: N/A;   OOPHORECTOMY Right 06/2003    Endometriosis    PELVIC LAPAROSCOPY  2004   several. endometriosis    ROBOTIC ASSISTED SALPINGO OOPHERECTOMY Left 12/09/2015   Procedure: LAPAROSCOPY WITH PELVIC WASHINGS & LYSIS OF ADHESIONS;  Surgeon: Nunzio Cobbs, MD;  Location: [facility];  Service: Gynecology;  Laterality: Left;  Dr. Quincy Simmonds called at 6:45am to tell team not to drape robot out. The team had already set the room up and draped the robot. The case was posted  for a robotic salpingo oophorectomy    Current Outpatient Medications  Medication Sig Dispense Refill   buPROPion (WELLBUTRIN XL) 300 MG 24 hr tablet Take 1 tablet (300 mg total) by mouth daily. 30 tablet 11   estradiol (ESTRACE) 1 MG tablet Take 2 tablets ( 2 mg total) per day. 60 tablet 11   glucosamine-chondroitin 500-400 MG tablet Take 1 tablet by mouth daily.     ibuprofen (ADVIL,MOTRIN) 800 MG tablet Take 1 tablet (800 mg total) by mouth every 8 (eight) hours as needed for mild pain. 30 tablet 1   LORazepam (ATIVAN) 0.5 MG tablet Take by mouth.     Multiple Vitamin (MULTIVITAMIN) tablet Take 1 tablet by mouth daily.     NON FORMULARY Take 1 tablet by  mouth daily. Calm-aid     No current facility-administered medications for this visit.    Family History  Problem Relation Age of Onset   Breast cancer Mother 62   Diabetes Maternal Uncle    Diabetes Maternal Grandmother    Stroke Maternal Grandmother    Hypertension Maternal Grandmother    Diabetes Maternal Uncle    Lung disease Maternal Grandfather        related to working in saw mill    Review of Systems  All other systems reviewed and are negative.  Exam:   BP 112/84    Pulse 93    Ht 5' 4.5" (1.638 m)    Wt 223 lb (101.2 kg)    LMP 02/03/2016 (Exact Date)    SpO2 98%    BMI 37.69 kg/m     General appearance: alert, cooperative and appears stated age Head: normocephalic, without obvious abnormality, atraumatic Neck: no adenopathy, supple, symmetrical, trachea midline and thyroid normal to inspection and palpation Lungs: clear to auscultation bilaterally Breasts: normal appearance, no masses or tenderness, No nipple retraction or dimpling, No nipple discharge or bleeding, No axillary adenopathy Heart: regular rate and rhythm Abdomen: soft, non-tender; no masses, no organomegaly Extremities: extremities normal, atraumatic, no cyanosis or edema Skin: skin color, texture, turgor normal. Left groin with erythema in flexural fold.  Lymph nodes: cervical, supraclavicular, and axillary nodes normal. Neurologic: grossly normal  Pelvic: External genitalia:  no lesions              No abnormal inguinal nodes palpated.              Urethra:  normal appearing urethra with no masses, tenderness or lesions              Bartholins and Skenes: normal                 Vagina: normal appearing vagina with normal color and discharge, no lesions              Cervix: absent              Pap taken: no Bimanual Exam:  Uterus:  absent              Adnexa: no mass, fullness, tenderness              Rectal exam: yes.  Confirms.              Anus:  normal sphincter tone, no lesions  Chaperone  was present for exam:  Estill Bamberg, CMA  Assessment:   Well woman visit with gynecologic exam. Status post TAH/left salpingo-oophorectomy.  Status post prior right salpingo-oophorectomy.  Hx endometriosis. ERT. Depression.  On Wellbutrin XL.  FH breast cancer in mother.  Bereavement.  Candida of flexural fold.   Plan: Mammogram screening discussed. Self breast awareness reviewed. Pap and HR HPV as above. Guidelines for Calcium, Vitamin D, regular exercise program including cardiovascular and weight bearing exercise. Refill of Wellbutrin XL 300 mg daily.  Refill of Estrace 2 mg daily.  #60, RF: 11.   We discussed increased risk of thromboembolic events and potential effect on an existing breast cancer.  Bereavement support given.  Rx for Nystatin powder.  Routine labs.  Follow up annually and prn.   After visit summary provided.

## 2021-09-28 ENCOUNTER — Ambulatory Visit (INDEPENDENT_AMBULATORY_CARE_PROVIDER_SITE_OTHER): Payer: Commercial Managed Care - PPO | Admitting: Obstetrics and Gynecology

## 2021-09-28 ENCOUNTER — Other Ambulatory Visit: Payer: Self-pay

## 2021-09-28 ENCOUNTER — Encounter: Payer: Self-pay | Admitting: Obstetrics and Gynecology

## 2021-09-28 VITALS — BP 112/84 | HR 93 | Ht 64.5 in | Wt 223.0 lb

## 2021-09-28 DIAGNOSIS — Z0000 Encounter for general adult medical examination without abnormal findings: Secondary | ICD-10-CM | POA: Diagnosis not present

## 2021-09-28 DIAGNOSIS — Z01419 Encounter for gynecological examination (general) (routine) without abnormal findings: Secondary | ICD-10-CM | POA: Diagnosis not present

## 2021-09-28 MED ORDER — ESTRADIOL 1 MG PO TABS: ORAL_TABLET | ORAL | 11 refills | Status: DC

## 2021-09-28 MED ORDER — NYSTATIN 100000 UNIT/GM EX POWD: 1.0000 "application " | Freq: Three times a day (TID) | CUTANEOUS | 2 refills | Status: AC

## 2021-09-28 MED ORDER — BUPROPION HCL ER (XL) 300 MG PO TB24: 300.0000 mg | ORAL_TABLET | Freq: Every day | ORAL | 11 refills | Status: DC

## 2021-09-28 NOTE — Patient Instructions (Addendum)
Consider reading Bearing the Unbearable by Sherilyn Banker, Psy-D. You can purchase this online through [employer].   Consider [place] at [facility], Dr. Suzi Roots of [place] in [place], [facility], or [facility].   EXERCISE AND DIET:  We recommended that you start or continue a regular exercise program for good health. Regular exercise means any activity that makes your heart beat faster and makes you sweat.  We recommend exercising at least 30 minutes per day at least 3 days a week, preferably 4 or 5.  We also recommend a diet low in fat and sugar.  Inactivity, poor dietary choices and obesity can cause diabetes, heart attack, stroke, and kidney damage, among others.    ALCOHOL AND SMOKING:  Women should limit their alcohol intake to no more than 7 drinks/beers/glasses of wine (combined, not each!) per week. Moderation of alcohol intake to this level decreases your risk of breast cancer and liver damage. And of course, no recreational drugs are part of a healthy lifestyle.  And absolutely no smoking or even second hand smoke. Most people know smoking can cause heart and lung diseases, but did you know it also contributes to weakening of your bones? Aging of your skin?  Yellowing of your teeth and nails?  CALCIUM AND VITAMIN D:  Adequate intake of calcium and Vitamin D are recommended.  The recommendations for exact amounts of these supplements seem to change often, but generally speaking 600 mg of calcium (either carbonate or citrate) and 800 units of Vitamin D per day seems prudent. Certain women may benefit from higher intake of Vitamin D.  If you are among these women, your doctor will have told you during your visit.    PAP SMEARS:  Pap smears, to check for cervical cancer or precancers,  have traditionally been done yearly, although recent scientific advances have shown that most women can have pap smears less often.  However, every woman  still should have a physical exam from her gynecologist every year. It will include a breast check, inspection of the vulva and vagina to check for abnormal growths or skin changes, a visual exam of the cervix, and then an exam to evaluate the size and shape of the uterus and ovaries.  And after 52 years of age, a rectal exam is indicated to check for rectal cancers. We will also provide age appropriate advice regarding health maintenance, like when you should have certain vaccines, screening for sexually transmitted diseases, bone density testing, colonoscopy, mammograms, etc.   MAMMOGRAMS:  All women over 5 years old should have a yearly mammogram. Many facilities now offer a "3D" mammogram, which may cost around $50 extra out of pocket. If possible,  we recommend you accept the option to have the 3D mammogram performed.  It both reduces the number of women who will be called back for extra views which then turn out to be normal, and it is better than the routine mammogram at detecting truly abnormal areas.    COLONOSCOPY:  Colonoscopy to screen for colon cancer is recommended for all women at age 6.  We know, you hate the idea of the prep.  We agree, BUT, having colon cancer and not knowing it is worse!!  Colon cancer so often starts as a polyp that can be seen and removed at colonscopy, which can quite literally save your life!  And if your first colonoscopy is normal and you have no family history of colon cancer, most women  don't have to have it again for 10 years.  Once every ten years, you can do something that may end up saving your life, right?  We will be happy to help you get it scheduled when you are ready.  Be sure to check your insurance coverage so you understand how much it will cost.  It may be covered as a preventative service at no cost, but you should check your particular policy.

## 2021-09-29 LAB — CBC
HCT: 51.2 % — ABNORMAL HIGH (ref 35.0–45.0)
Hemoglobin: 17.4 g/dL — ABNORMAL HIGH (ref 11.7–15.5)
MCH: 32.3 pg (ref 27.0–33.0)
MCHC: 34 g/dL (ref 32.0–36.0)
MCV: 95 fL (ref 80.0–100.0)
MPV: 9.4 fL (ref 7.5–12.5)
Platelets: 289 10*3/uL (ref 140–400)
RBC: 5.39 10*6/uL — ABNORMAL HIGH (ref 3.80–5.10)
RDW: 11.8 % (ref 11.0–15.0)
WBC: 6.5 10*3/uL (ref 3.8–10.8)

## 2021-09-29 LAB — COMPREHENSIVE METABOLIC PANEL
AG Ratio: 1.3 (calc) (ref 1.0–2.5)
ALT: 17 U/L (ref 6–29)
AST: 21 U/L (ref 10–35)
Albumin: 4.1 g/dL (ref 3.6–5.1)
Alkaline phosphatase (APISO): 72 U/L (ref 37–153)
BUN: 7 mg/dL (ref 7–25)
CO2: 21 mmol/L (ref 20–32)
Calcium: 9.4 mg/dL (ref 8.6–10.4)
Chloride: 109 mmol/L (ref 98–110)
Creat: 0.69 mg/dL (ref 0.50–1.03)
Globulin: 3.1 g/dL (calc) (ref 1.9–3.7)
Glucose, Bld: 96 mg/dL (ref 65–99)
Potassium: 4.3 mmol/L (ref 3.5–5.3)
Sodium: 140 mmol/L (ref 135–146)
Total Bilirubin: 0.7 mg/dL (ref 0.2–1.2)
Total Protein: 7.2 g/dL (ref 6.1–8.1)

## 2021-09-29 LAB — LIPID PANEL
Cholesterol: 191 mg/dL (ref <200)
HDL: 68 mg/dL (ref >=50)
LDL Cholesterol (Calc): 102 mg/dL (calc) — ABNORMAL HIGH
Non-HDL Cholesterol (Calc): 123 mg/dL (calc) (ref <130)
Total CHOL/HDL Ratio: 2.8 (calc) (ref <5.0)
Triglycerides: 111 mg/dL (ref <150)

## 2021-09-29 LAB — TSH: TSH: 2.35 mIU/L

## 2021-09-30 ENCOUNTER — Encounter: Payer: Self-pay | Admitting: Obstetrics and Gynecology

## 2021-09-30 ENCOUNTER — Telehealth: Payer: Self-pay

## 2021-09-30 ENCOUNTER — Telehealth: Payer: Self-pay | Admitting: Obstetrics and Gynecology

## 2021-09-30 DIAGNOSIS — R718 Other abnormality of red blood cells: Secondary | ICD-10-CM

## 2021-09-30 DIAGNOSIS — D582 Other hemoglobinopathies: Secondary | ICD-10-CM

## 2021-09-30 NOTE — Telephone Encounter (Signed)
Dr. Quincy Simmonds- I do not know of hematologists in these cities.

## 2021-09-30 NOTE — Telephone Encounter (Signed)
If [place] would work for her, she could see Dr. Marin Olp, who works with [facility]. His office is at [employer]	[place].   I am not familiar with hematologists in [place] or [place], but would be happy to refer her to someone there if she has a preferred provider.

## 2021-09-30 NOTE — Telephone Encounter (Signed)
This was within a My Chart message I helped her with. I wanted to be sure you saw this part of the message.  "Dr. Quincy Simmonds Thank you so much for your kindness on Monday.  Experience has taught me that words are woefully inadequate but, I'm so sorry for your loss. Fatiha "

## 2021-10-01 ENCOUNTER — Telehealth: Payer: Self-pay | Admitting: Hematology

## 2021-10-01 NOTE — Telephone Encounter (Signed)
MyChart message sent to patient informing her 

## 2021-10-01 NOTE — Telephone Encounter (Signed)
Scheduled appt per 1/11 referral. Pt is aware of appt date and time. Pt is aware to arrive 15 mins prior to appt time.

## 2021-10-14 ENCOUNTER — Other Ambulatory Visit: Payer: Self-pay

## 2021-10-14 ENCOUNTER — Inpatient Hospital Stay: Payer: Commercial Managed Care - PPO | Attending: Hematology | Admitting: Hematology

## 2021-10-14 ENCOUNTER — Inpatient Hospital Stay: Payer: Commercial Managed Care - PPO

## 2021-10-14 VITALS — BP 127/71 | HR 74 | Temp 97.9°F | Resp 20 | Ht 64.5 in | Wt 222.9 lb

## 2021-10-14 DIAGNOSIS — Z90721 Acquired absence of ovaries, unilateral: Secondary | ICD-10-CM | POA: Diagnosis not present

## 2021-10-14 DIAGNOSIS — Z90710 Acquired absence of both cervix and uterus: Secondary | ICD-10-CM | POA: Diagnosis not present

## 2021-10-14 DIAGNOSIS — D751 Secondary polycythemia: Secondary | ICD-10-CM

## 2021-10-14 DIAGNOSIS — Z803 Family history of malignant neoplasm of breast: Secondary | ICD-10-CM | POA: Diagnosis not present

## 2021-10-14 LAB — CBC WITH DIFFERENTIAL/PLATELET
Abs Immature Granulocytes: 0.01 10*3/uL (ref 0.00–0.07)
Basophils Absolute: 0 10*3/uL (ref 0.0–0.1)
Basophils Relative: 1 %
Eosinophils Absolute: 0.1 10*3/uL (ref 0.0–0.5)
Eosinophils Relative: 2 %
HCT: 48.9 % — ABNORMAL HIGH (ref 36.0–46.0)
Hemoglobin: 17 g/dL — ABNORMAL HIGH (ref 12.0–15.0)
Immature Granulocytes: 0 %
Lymphocytes Relative: 30 %
Lymphs Abs: 2.1 10*3/uL (ref 0.7–4.0)
MCH: 32.3 pg (ref 26.0–34.0)
MCHC: 34.8 g/dL (ref 30.0–36.0)
MCV: 92.8 fL (ref 80.0–100.0)
Monocytes Absolute: 0.6 10*3/uL (ref 0.1–1.0)
Monocytes Relative: 8 %
Neutro Abs: 4.3 10*3/uL (ref 1.7–7.7)
Neutrophils Relative %: 59 %
Platelets: 257 10*3/uL (ref 150–400)
RBC: 5.27 MIL/uL — ABNORMAL HIGH (ref 3.87–5.11)
RDW: 12.3 % (ref 11.5–15.5)
WBC: 7.2 10*3/uL (ref 4.0–10.5)
nRBC: 0 % (ref 0.0–0.2)

## 2021-10-14 LAB — CMP (CANCER CENTER ONLY)
ALT: 22 U/L (ref 0–44)
AST: 25 U/L (ref 15–41)
Albumin: 4 g/dL (ref 3.5–5.0)
Alkaline Phosphatase: 69 U/L (ref 38–126)
Anion gap: 8 (ref 5–15)
BUN: 8 mg/dL (ref 6–20)
CO2: 25 mmol/L (ref 22–32)
Calcium: 9.3 mg/dL (ref 8.9–10.3)
Chloride: 106 mmol/L (ref 98–111)
Creatinine: 0.69 mg/dL (ref 0.44–1.00)
GFR, Estimated: >60 mL/min (ref >60)
Glucose, Bld: 94 mg/dL (ref 70–99)
Potassium: 4.1 mmol/L (ref 3.5–5.1)
Sodium: 139 mmol/L (ref 135–145)
Total Bilirubin: 0.7 mg/dL (ref 0.3–1.2)
Total Protein: 8.1 g/dL (ref 6.5–8.1)

## 2021-10-14 LAB — RETICULOCYTES
Immature Retic Fract: 10.8 % (ref 2.3–15.9)
RBC.: 5.13 MIL/uL — ABNORMAL HIGH (ref 3.87–5.11)
Retic Count, Absolute: 74.4 10*3/uL (ref 19.0–186.0)
Retic Ct Pct: 1.5 % (ref 0.4–3.1)

## 2021-10-14 NOTE — Progress Notes (Addendum)
[name]   HEMATOLOGY/ONCOLOGY CONSULTATION NOTE  Date of Service: 10/14/2021  Patient Care Team: Clois Dupes., MD as PCP - General (Family Medicine)  CHIEF COMPLAINTS/PURPOSE OF CONSULTATION:  Polycythemia  HISTORY OF PRESENTING ILLNESS:  Theresa Singleton is a wonderful 51 y.o. female who has been referred to [place] by Dr .Clois Dupes., MD /Dr. Joline Maxcy MD for evaluation and management of polycythemia.  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  Normal WBC count of 6.5k and normal platelets of 289k.  Previous labs in our system in December 2021 showed a hemoglobin of 16.5 with a hematocrit of 47.7.  With normal WBC count and platelets. Patient notes no acute new symptoms.  No new headaches, leg swelling, shortness of breath or chest pain, vision difficulties.  She has had a previous total abdominal hysterectomy with left salpingo-oophorectomy in 2017.  Previous right oophorectomy in 2004.  Both of these were related to endometriosis.  Patient is on chronic estradiol replacement at 2 mg p.o. daily by her OB/GYN doctor.  She has no personal or family history of venous thromboembolism or arterial thrombosis. Family history of breast cancer in her mother.  She notes no other acute new focal symptoms.  MEDICAL HISTORY:  Past Medical History:  Diagnosis Date   Abnormal Pap smear of cervix 2016   CIN I   Amenorrhea    Anemia    Hx   Anxiety    Cervical stenosis (uterine cervix)    Depression    Diverticulitis    Endometriosis    Infertility, female    Seasonal allergies    SVD (spontaneous vaginal delivery)    x 1    SURGICAL HISTORY: Past Surgical History:  Procedure Laterality Date   ABDOMINAL HYSTERECTOMY N/A 02/17/2016   Procedure: HYSTERECTOMY TOTAL ABDOMINAL WITH LSO, EXTENSIVE LYSIS OF  ADHESION AND POSSIBLE CYSTOSCOPY;  Surgeon: Nunzio Cobbs, MD;  Location: [facility];  Service: Gynecology;  Laterality: N/A;  LEFT SALPINGO OOPHORECTOMY and lysis of adhesions    COLONOSCOPY     CYSTOSCOPY N/A 02/17/2016   Procedure: CYSTOSCOPY;  Surgeon: Nunzio Cobbs, MD;  Location: [facility];  Service: Gynecology;  Laterality: N/A;   ENDOMETRIAL ABLATION  2003   HYSTEROSCOPY  2003   HYSTEROSCOPY WITH D & C N/A 12/09/2015   Procedure: DILATATION AND CURETTAGE /HYSTEROSCOPY;  Surgeon: Nunzio Cobbs, MD;  Location: [facility];  Service: Gynecology;  Laterality: N/A;   OOPHORECTOMY Right 06/2003    Endometriosis    PELVIC LAPAROSCOPY  2004   several. endometriosis    ROBOTIC ASSISTED SALPINGO OOPHERECTOMY Left 12/09/2015   Procedure: LAPAROSCOPY WITH PELVIC WASHINGS & LYSIS OF ADHESIONS;  Surgeon: Nunzio Cobbs, MD;  Location: [facility];  Service: Gynecology;  Laterality: Left;  Dr. Quincy Simmonds called at 6:45am to tell team not to drape robot out. The team had already set the room up and draped the robot. The case was posted for a robotic salpingo oophorectomy    SOCIAL HISTORY: Social History   Socioeconomic History   Marital status: Divorced    Spouse name: Not on file   Number of children: Not on file   Years of education: Not on file   Highest education level: Not on file  Occupational History   Not on  file  Tobacco Use   Smoking status: Never   Smokeless tobacco: Never  Vaping Use   Vaping Use: Never used  Substance and Sexual Activity   Alcohol use: Not Currently    Comment: 1 drink/month   Drug use: No   Sexual activity: Not Currently    Partners: Male    Birth control/protection: Surgical    Comment: TAH/LSO  Other Topics Concern   Not on file  Social History Narrative   Not on file   Social Determinants of Health   Financial Resource Strain: Not on file  Food Insecurity: Not on file  Transportation Needs: Not on file  Physical Activity:  Not on file  Stress: Not on file  Social Connections: Not on file  Intimate Partner Violence: Not on file    FAMILY HISTORY: Family History  Problem Relation Age of Onset   Breast cancer Mother 65   Diabetes Maternal Uncle    Diabetes Maternal Grandmother    Stroke Maternal Grandmother    Hypertension Maternal Grandmother    Diabetes Maternal Uncle    Lung disease Maternal Grandfather        related to working in saw mill    ALLERGIES:  is allergic to aspirin and codeine.  MEDICATIONS:  Current Outpatient Medications  Medication Sig Dispense Refill   buPROPion (WELLBUTRIN XL) 300 MG 24 hr tablet Take 1 tablet (300 mg total) by mouth daily. 30 tablet 11   estradiol (ESTRACE) 1 MG tablet Take 2 tablets ( 2 mg total) per day. 60 tablet 11   glucosamine-chondroitin 500-400 MG tablet Take 1 tablet by mouth daily.     ibuprofen (ADVIL,MOTRIN) 800 MG tablet Take 1 tablet (800 mg total) by mouth every 8 (eight) hours as needed for mild pain. 30 tablet 1   LORazepam (ATIVAN) 0.5 MG tablet Take by mouth.     Multiple Vitamin (MULTIVITAMIN) tablet Take 1 tablet by mouth daily.     NON FORMULARY Take 1 tablet by mouth daily. Calm-aid     nystatin (MYCOSTATIN/NYSTOP) powder Apply 1 application topically 3 (three) times daily. Apply to affected area for up to 7 days 15 g 2   No current facility-administered medications for this visit.    REVIEW OF SYSTEMS:    10 Point review of Systems was done is negative except as noted above.  PHYSICAL EXAMINATION: ECOG PERFORMANCE STATUS: 0  . Vitals:   10/14/21 1445  BP: 127/71  Pulse: 74  Resp: 20  Temp: 97.9 F (36.6 C)  SpO2: 99%   Filed Weights   10/14/21 1445  Weight: 222 lb 14.4 oz (101.1 kg)   .Body mass index is 37.67 kg/m.  GENERAL:alert, in no acute distress and comfortable SKIN: no acute rashes, no significant lesions EYES: conjunctiva are pink and non-injected, sclera anicteric OROPHARYNX: MMM, no exudates, no  oropharyngeal erythema or ulceration NECK: supple, no JVD LYMPH:  no palpable lymphadenopathy in the cervical, axillary or inguinal regions LUNGS: clear to auscultation b/l with normal respiratory effort HEART: regular rate & rhythm ABDOMEN:  normoactive bowel sounds , non tender, not distended.  No palpable hepatosplenomegaly Extremity: no pedal edema PSYCH: alert & oriented x 3 with fluent speech NEURO: no focal motor/sensory deficits  LABORATORY DATA:  I have reviewed the data as listed  . CBC Latest Ref Rng & Units 09/28/2021 09/03/2020 09/03/2019  WBC 3.8 - 10.8 Thousand/uL 6.5 6.0 5.6  Hemoglobin 11.7 - 15.5 g/dL 17.4(H) 16.5(H) 15.8  Hematocrit 35.0 - 45.0 %  51.2(H) 47.7(H) 45.0  Platelets 140 - 400 Thousand/uL 289 311 293    . CMP Latest Ref Rng & Units 09/28/2021 09/03/2020 09/03/2019  Glucose 65 - 99 mg/dL 96 72 81  BUN 7 - 25 mg/dL 7 8 12   Creatinine 0.50 - 1.03 mg/dL 0.69 0.82 0.84  Sodium 135 - 146 mmol/L 140 139 145(H)  Potassium 3.5 - 5.3 mmol/L 4.3 4.2 4.5  Chloride 98 - 110 mmol/L 109 104 107(H)  CO2 20 - 32 mmol/L 21 24 26   Calcium 8.6 - 64.4 mg/dL 9.4 9.0 8.9  Total Protein 6.1 - 8.1 g/dL 7.2 7.2 6.8  Total Bilirubin 0.2 - 1.2 mg/dL 0.7 0.3 0.3  Alkaline Phos 44 - 121 IU/L - 82 90  AST 10 - 35 U/L 21 24 25   ALT 6 - 29 U/L 17 22 28      RADIOGRAPHIC STUDIES: I have personally reviewed the radiological images as listed and agreed with the findings in the report. No results found.  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  17.4 with a hematocrit of 51.2. Patient has had no previous history of venous thromboembolism or arterial thrombosis. No other focal symptoms related to this currently. PLAN -I discussed patient's available labs with her. -We discussed the potential etiologies of polycythemia including primary bone marrow disorders such as polycythemia vera versus secondary  etiologies causing plasma volume contraction or increased Red cell mass. -Given the patient is on chronic estrogen therapy which would be an independent risk factor for venous thromboembolism we did recommend ruling out clonal erythropoiesis. -JAK2 MPN panel testing. -Erythropoietin level -Repeat CBC CMP ferritin -Recent TSH within normal limits at 2.35 -There is some limited data showing estrogen replacement could potentially also increase hematocrit levels somewhat. -Encouraged patient to maintain good p.o. hydration with at least 2 L of water daily. -Regular age-appropriate cancer screening with primary care physician.  Follow-up Labs today Phone visit with Dr Irene Limbo in 2 weeks  . Orders Placed This Encounter  Procedures   CBC with Differential/Platelet    Standing Status:   Future    Number of Occurrences:   1    Standing Expiration Date:   10/14/2022   CMP ([place] only)    Standing Status:   Future    Number of Occurrences:   1    Standing Expiration Date:   10/14/2022   JAK2 (including V617F and Exon 12), MPL, and CALR-Next Generation Sequencing    Standing Status:   Future    Number of Occurrences:   1    Standing Expiration Date:   10/14/2022   Ferritin    Standing Status:   Future    Number of Occurrences:   1    Standing Expiration Date:   10/14/2022   Reticulocytes    Standing Status:   Future    Number of Occurrences:   1    Standing Expiration Date:   10/14/2022   Erythropoietin    Standing Status:   Future    Number of Occurrences:   1    Standing Expiration Date:   10/14/2022    All of the patients questions were answered with apparent satisfaction. The patient knows to call the clinic with any problems, questions or concerns.  I spent 25 mins counseling the patient face to face. The total time 35 mins spent in the appointment was and more than 50% was on counseling and direct patient  cares.    Sullivan Lone MD [place] AAHIVMS [facility] Hematology/Oncology  Physician [facility]  (Office):       [phone] (Work cell):  [phone] (Fax):           [phone]  10/14/2021 3:07 PM

## 2021-10-14 NOTE — Patient Instructions (Signed)
Thank you for choosing [facility] to provide your care.   Should you have questions after your visit to the [facility] ([facility]), please contact this office at [phone] between 8:30 AM and 4:30 PM.  Voice mails left after 4:00 PM may not be returned until the following business day.  Calls received after 4:30 PM will be answered by an off-site Nurse Triage Line.    Prescription Refills:  Please have your pharmacy contact us directly for most prescription requests.  Contact the office directly for refills of narcotics (pain medications). Allow 48-72 hours for refills.  Appointments: Please contact the [facility] scheduling department [phone] for questions regarding [facility] appointment scheduling.  Contact the schedulers with any scheduling changes so that your appointment can be rescheduled in a timely manner.   Central Scheduling for [place] [phone] - Call to schedule procedures such as PET scans, CT scans, MRI, Ultrasound, etc.  To afford each patient quality time with our providers, please arrive 30 minutes before your scheduled appointment time.  If you arrive late for your appointment, you may be asked to reschedule.  We strive to give you quality time with our providers, and arriving late affects you and other patients whose appointments are after yours. If you are a no show for multiple scheduled visits, you may be dismissed from the clinic at the providers discretion.     Resources: [facility] Social Workers [phone] for additional information on assistance programs or assistance connecting with community support programs   [place] DSS  [phone]: Information regarding food stamps, Medicaid, and utility assistance [place] Access [place] [phone]   [place] [employer]'s shared-ride transportation service for eligible riders who have a disability that prevents them from riding the fixed route bus.   Medicare Rights Center 800-333-4114  Helps people with Medicare understand their rights and benefits, navigate the Medicare system, and secure the quality healthcare they deserve [employer] 800-227-2345 Assists patients locate various types of support and financial assistance Cancer Care: 1-800-813-HOPE (4673) Provides financial assistance, online support groups, medication/co-pay assistance.   Transportation Assistance for appointments at [facility]: Transportation Coordinator [phone]  Again, thank you for choosing  Cancer Center for your care.       

## 2021-10-15 LAB — ERYTHROPOIETIN: Erythropoietin: 7.1 m[IU]/mL (ref 2.6–18.5)

## 2021-10-15 LAB — FERRITIN: Ferritin: 79 ng/mL (ref 11–307)

## 2021-10-17 ENCOUNTER — Encounter: Payer: Self-pay | Admitting: Hematology

## 2021-10-22 LAB — JAK2 (INCLUDING V617F AND EXON 12), MPL,& CALR-NEXT GEN SEQ

## 2021-10-28 ENCOUNTER — Inpatient Hospital Stay: Payer: Commercial Managed Care - PPO | Attending: Hematology | Admitting: Hematology

## 2021-10-28 DIAGNOSIS — D751 Secondary polycythemia: Secondary | ICD-10-CM | POA: Insufficient documentation

## 2021-11-03 NOTE — Progress Notes (Addendum)
[name]   HEMATOLOGY/ONCOLOGY CLINIC NOTE  Date of Service: 11/11/2021  Patient Care Team: Samuel Bouche, NP as PCP - General (Nurse Practitioner)  CHIEF COMPLAINTS/PURPOSE OF CONSULTATION:  Follow-up to discuss lab work-up for polycythemia  HISTORY OF PRESENTING ILLNESS:   Theresa Singleton is a wonderful 51 y.o. female who has been referred to [place] by Dr .Samuel Bouche, NP /Dr. Joline Maxcy MD for evaluation and management of polycythemia.  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  Normal WBC count of 6.5k and normal platelets of 289k.  Previous labs in our system in December 2021 showed a hemoglobin of 16.5 with a hematocrit of 47.7.  With normal WBC count and platelets. Patient notes no acute new symptoms.  No new headaches, leg swelling, shortness of breath or chest pain, vision difficulties.  She has had a previous total abdominal hysterectomy with left salpingo-oophorectomy in 2017.  Previous right oophorectomy in 2004.  Both of these were related to endometriosis.  Patient is on chronic estradiol replacement at 2 mg p.o. daily by her OB/GYN doctor.  She has no personal or family history of venous thromboembolism or arterial thrombosis. Family history of breast cancer in her mother.  She notes no other acute new focal symptoms.  INTERVAL HISTORY  .I connected with Emelda Fear on 02/08/2023at 11:40 AM EST by telephone visit and verified that I am speaking with the correct person using two identifiers.   I discussed the limitations, risks, security and privacy concerns of performing an evaluation and management service by telemedicine and the availability of in-person appointments. I also discussed with the patient that there may be a patient responsible charge related to this service. The patient expressed  understanding and agreed to proceed.   Other persons participating in the visit and their role in the encounter: None  Patients location: Home Providers location: [place]  Chief Complaint: Discussion of lab results of work-up for polycythemia vera.  Patient has no new symptoms since her last clinic visit. Labs showed hemoglobin of 17 with a hematocrit of 48.9 which is down from 51.2 a month ago WBC count and platelets within normal limits. Patient erythropoietin levels are normal and JAK2 mutation testing was negative which makes polycythemia vera very unlikely.  MEDICAL HISTORY:  Past Medical History:  Diagnosis Date   Abnormal Pap smear of cervix 2016   CIN I   Amenorrhea    Anemia    Hx   Anxiety    Cervical stenosis (uterine cervix)    Depression    Diverticulitis    Endometriosis    Infertility, female    Seasonal allergies    SVD (spontaneous vaginal delivery)    x 1    SURGICAL HISTORY: Past Surgical History:  Procedure Laterality Date   ABDOMINAL HYSTERECTOMY N/A 02/17/2016   Procedure: HYSTERECTOMY TOTAL ABDOMINAL WITH LSO, EXTENSIVE LYSIS OF ADHESION AND POSSIBLE CYSTOSCOPY;  Surgeon: Nunzio Cobbs, MD;  Location: [facility];  Service: Gynecology;  Laterality: N/A;  LEFT SALPINGO OOPHORECTOMY and lysis of adhesions    COLONOSCOPY     CYSTOSCOPY N/A 02/17/2016   Procedure: CYSTOSCOPY;  Surgeon: Nunzio Cobbs, MD;  Location: [facility];  Service: Gynecology;  Laterality: N/A;   ENDOMETRIAL ABLATION  2003   HYSTEROSCOPY  2003   HYSTEROSCOPY WITH  D & C N/A 12/09/2015   Procedure: DILATATION AND CURETTAGE /HYSTEROSCOPY;  Surgeon: Nunzio Cobbs, MD;  Location: [facility];  Service: Gynecology;  Laterality: N/A;   OOPHORECTOMY Right 06/2003    Endometriosis    PELVIC LAPAROSCOPY  2004   several. endometriosis    ROBOTIC ASSISTED SALPINGO OOPHERECTOMY Left 12/09/2015   Procedure: LAPAROSCOPY WITH PELVIC WASHINGS & LYSIS OF  ADHESIONS;  Surgeon: Nunzio Cobbs, MD;  Location: [facility];  Service: Gynecology;  Laterality: Left;  Dr. Quincy Simmonds called at 6:45am to tell team not to drape robot out. The team had already set the room up and draped the robot. The case was posted for a robotic salpingo oophorectomy    SOCIAL HISTORY: Social History   Socioeconomic History   Marital status: Divorced    Spouse name: Not on file   Number of children: Not on file   Years of education: Not on file   Highest education level: Not on file  Occupational History   Occupation: Therapist  Tobacco Use   Smoking status: Never    Passive exposure: Never   Smokeless tobacco: Never  Vaping Use   Vaping Use: Never used  Substance and Sexual Activity   Alcohol use: Not Currently    Comment: 1 drink/month   Drug use: No   Sexual activity: Not Currently    Partners: Male    Birth control/protection: Surgical    Comment: TAH/LSO  Other Topics Concern   Not on file  Social History Narrative   Not on file   Social Determinants of Health   Financial Resource Strain: Not on file  Food Insecurity: Not on file  Transportation Needs: Not on file  Physical Activity: Not on file  Stress: Not on file  Social Connections: Not on file  Intimate Partner Violence: Not on file    FAMILY HISTORY: Family History  Problem Relation Age of Onset   Breast cancer Mother 33   Diabetes Maternal Uncle    Diabetes Maternal Grandmother    Stroke Maternal Grandmother    Hypertension Maternal Grandmother    Diabetes Maternal Uncle    Lung disease Maternal Grandfather        related to working in saw mill    ALLERGIES:  is allergic to aspirin and codeine.  MEDICATIONS:  Current Outpatient Medications  Medication Sig Dispense Refill   buPROPion (WELLBUTRIN XL) 300 MG 24 hr tablet Take 1 tablet (300 mg total) by mouth daily. 30 tablet 11   estradiol (ESTRACE) 1 MG tablet Take 2 tablets ( 2 mg total) per day. 60 tablet 11    glucosamine-chondroitin 500-400 MG tablet Take 1 tablet by mouth daily.     LORazepam (ATIVAN) 1 MG tablet Take 0.5-1 tablets (0.5-1 mg total) by mouth daily as needed for anxiety. 30 tablet 4   Multiple Vitamin (MULTIVITAMIN) tablet Take 1 tablet by mouth daily.     nystatin (MYCOSTATIN/NYSTOP) powder Apply 1 application topically 3 (three) times daily. Apply to affected area for up to 7 days 15 g 2   No current facility-administered medications for this visit.    REVIEW OF SYSTEMS:   10 Point review of Systems was done is negative except as noted above.  PHYSICAL EXAMINATION: Telemedicine visit  LABORATORY DATA:  I have reviewed the data as listed  . CBC Latest Ref Rng & Units 10/14/2021 09/28/2021 09/03/2020  WBC 4.0 - 10.5 K/uL 7.2 6.5 6.0  Hemoglobin 12.0 - 15.0 g/dL 17.0(H)  17.4(H) 16.5(H)  Hematocrit 36.0 - 46.0 % 48.9(H) 51.2(H) 47.7(H)  Platelets 150 - 400 K/uL 257 289 311    . CMP Latest Ref Rng & Units 10/14/2021 09/28/2021 09/03/2020  Glucose 70 - 99 mg/dL 94 96 72  BUN 6 - 20 mg/dL 8 7 8   Creatinine 0.44 - 1.00 mg/dL 0.69 0.69 0.82  Sodium 135 - 145 mmol/L 139 140 139  Potassium 3.5 - 5.1 mmol/L 4.1 4.3 4.2  Chloride 98 - 111 mmol/L 106 109 104  CO2 22 - 32 mmol/L 25 21 24   Calcium 8.9 - 10.3 mg/dL 9.3 9.4 9.0  Total Protein 6.5 - 8.1 g/dL 8.1 7.2 7.2  Total Bilirubin 0.3 - 1.2 mg/dL 0.7 0.7 0.3  Alkaline Phos 38 - 126 U/L 69 - 82  AST 15 - 41 U/L 25 21 24   ALT 0 - 44 U/L 22 17 22    Component     Latest Ref Rng & Units 10/14/2021  Retic Ct Pct     0.4 - 3.1 % 1.5  RBC.     3.87 - 5.11 MIL/uL 5.13 (H)  Retic Count, Absolute     19.0 - 186.0 K/uL 74.4  Immature Retic Fract     2.3 - 15.9 % 10.8  Erythropoietin     2.6 - 18.5 mIU/mL 7.1  Ferritin     11 - 307 ng/mL 79     RADIOGRAPHIC STUDIES: I have personally reviewed the radiological images as listed and agreed with the findings in the report. No results found.  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  17.4 with a hematocrit of 51.2. Patient has had no previous history of venous thromboembolism or arterial thrombosis. No other focal symptoms related to this currently. PLAN -Labs done were reviewed in detail with the patient -JAK2 mutation testing negative, MPN panel negative this rules out a clonal erythrocytosis with 27% certainty. -Patient also has normal erythropoietin level which suggests against a primary bone marrow problem. -We discussed that this elevation in her hemoglobin and hematocrit appear to be secondary.  It could be from volume contraction, potentially from estrogen replacement at a higher dose, other possibilities like sleep apnea would also need to be ruled out with her primary care physician. -Given her polycythemia and family history of breast cancer age more than 82yrs I strongly recommended she talk to her primary care physician and OB/GYN doctor about getting off hormone replacement therapy. -Follow-up with primary care physician to consider outpatient sleep study.  Polycythemia persists despite being off hormone replacement therapy. -No indication for therapeutic phlebotomy at this time in the absence of finding of polycythemia vera. -I encouraged her to maintain good hydration of at least 2 L of water daily. -Continued age-appropriate cancer screening with primary care physician -We shall see her back with labs at least 1 more time to evaluate the trend of her counts after some of the above have been addressed.  Follow-up RTC with Dr Irene Limbo with labs in [place] months   Sullivan Lone MD [place] Hematology/Oncology Physician [facility]

## 2021-11-09 ENCOUNTER — Encounter: Payer: Self-pay | Admitting: Medical-Surgical

## 2021-11-09 ENCOUNTER — Other Ambulatory Visit: Payer: Self-pay

## 2021-11-09 ENCOUNTER — Ambulatory Visit: Payer: Commercial Managed Care - PPO | Admitting: Medical-Surgical

## 2021-11-09 VITALS — BP 150/83 | HR 80 | Temp 97.8°F | Ht 64.5 in | Wt 219.0 lb

## 2021-11-09 DIAGNOSIS — Z7689 Persons encountering health services in other specified circumstances: Secondary | ICD-10-CM

## 2021-11-09 DIAGNOSIS — F419 Anxiety disorder, unspecified: Secondary | ICD-10-CM

## 2021-11-09 DIAGNOSIS — F329 Major depressive disorder, single episode, unspecified: Secondary | ICD-10-CM | POA: Diagnosis not present

## 2021-11-09 MED ORDER — LORAZEPAM 1 MG PO TABS: 0.5000 mg | ORAL_TABLET | Freq: Every day | ORAL | 4 refills | Status: DC | PRN

## 2021-11-09 NOTE — Progress Notes (Signed)
New Patient Office Visit  Subjective:  Patient ID: Theresa Singleton, female    DOB: 1970/10/23  Age: 51 y.o. MRN: [id number]  CC:  Chief Complaint  Patient presents with   Transitions Of Care     HPI Theresa Singleton presents to establish care.  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  She is currently being treated for depression/anxiety as well as insomnia.  She lost her son abruptly after Thanksgiving and is struggling with grief and anxiety related to that.  She is currently doing counseling every 2 weeks.  Taking Wellbutrin 300 mg daily, tolerating well without side effects.  Using Ativan 1 mg at bedtime for anxiety and insomnia.  Uses this approximately 5-6 nights per week.  Has used Ativan in the past off and on but since losing her son, this has become more of a regular medication.  Denies SI/HI.  Was previously treated with Lexapro however this caused weight gain and was not exceedingly effective.  Feels that Wellbutrin is working well for her.  Past Medical History:  Diagnosis Date   Abnormal Pap smear of cervix 2016   CIN I   Amenorrhea    Anemia    Hx   Anxiety    Cervical stenosis (uterine cervix)    Depression    Diverticulitis    Endometriosis    Infertility, female    Seasonal allergies    SVD (spontaneous vaginal delivery)    x 1    Past Surgical History:  Procedure Laterality Date   ABDOMINAL HYSTERECTOMY N/A 02/17/2016   Procedure: HYSTERECTOMY TOTAL ABDOMINAL WITH LSO, EXTENSIVE LYSIS OF ADHESION AND POSSIBLE CYSTOSCOPY;  Surgeon: Nunzio Cobbs, MD;  Location: [facility];  Service: Gynecology;  Laterality: N/A;  LEFT SALPINGO OOPHORECTOMY and lysis of adhesions    COLONOSCOPY     CYSTOSCOPY N/A 02/17/2016   Procedure: CYSTOSCOPY;  Surgeon: Nunzio Cobbs, MD;  Location: [facility];  Service: Gynecology;  Laterality: N/A;   ENDOMETRIAL ABLATION  2003   HYSTEROSCOPY  2003   HYSTEROSCOPY  WITH D & C N/A 12/09/2015   Procedure: DILATATION AND CURETTAGE /HYSTEROSCOPY;  Surgeon: Nunzio Cobbs, MD;  Location: [facility];  Service: Gynecology;  Laterality: N/A;   OOPHORECTOMY Right 06/2003    Endometriosis    PELVIC LAPAROSCOPY  2004   several. endometriosis    ROBOTIC ASSISTED SALPINGO OOPHERECTOMY Left 12/09/2015   Procedure: LAPAROSCOPY WITH PELVIC WASHINGS & LYSIS OF ADHESIONS;  Surgeon: Nunzio Cobbs, MD;  Location: [facility];  Service: Gynecology;  Laterality: Left;  Dr. Quincy Simmonds called at 6:45am to tell team not to drape robot out. The team had already set the room up and draped the robot. The case was posted for a robotic salpingo oophorectomy    Family History  Problem Relation Age of Onset   Breast cancer Mother 33   Diabetes Maternal Uncle    Diabetes Maternal Grandmother    Stroke Maternal Grandmother    Hypertension Maternal Grandmother    Diabetes Maternal Uncle    Lung disease Maternal Grandfather        related to working in saw [employer]    Social History   Socioeconomic History   Marital status: Divorced    Spouse name: Not on file   Number of children: Not on file   Years of education: Not on file   Highest education  level: Not on file  Occupational History   Not on file  Tobacco Use   Smoking status: Never   Smokeless tobacco: Never  Vaping Use   Vaping Use: Never used  Substance and Sexual Activity   Alcohol use: Not Currently    Comment: 1 drink/month   Drug use: No   Sexual activity: Not Currently    Partners: Male    Birth control/protection: Surgical    Comment: TAH/LSO  Other Topics Concern   Not on file  Social History Narrative   Not on file   Social Determinants of Health   Financial Resource Strain: Not on file  Food Insecurity: Not on file  Transportation Needs: Not on file  Physical Activity: Not on file  Stress: Not on file  Social Connections: Not on file  Intimate Partner Violence: Not on file     ROS Review of Systems  Constitutional:  Negative for chills, fatigue, fever and unexpected weight change.  HENT:  Negative for congestion, rhinorrhea, sinus pressure and sore throat.   Respiratory:  Negative for cough, chest tightness and shortness of breath.   Cardiovascular:  Negative for chest pain, palpitations and leg swelling.  Gastrointestinal:  Negative for abdominal pain, constipation, diarrhea, nausea and vomiting.  Endocrine: Negative for cold intolerance and heat intolerance.  Genitourinary:  Negative for dysuria, frequency, urgency, vaginal bleeding and vaginal discharge.  Skin:  Negative for rash and wound.  Neurological:  Negative for dizziness, light-headedness and headaches.  Hematological:  Does not bruise/bleed easily.  Psychiatric/Behavioral:  Negative for self-injury, sleep disturbance and suicidal ideas. The patient is not nervous/anxious.    Objective:   Today's Vitals: BP (!) 150/83 (BP Location: Left Arm, Patient Position: Sitting, Cuff Size: Large)    Pulse 80    Temp 97.8 F (36.6 C)    Ht 5' 4.5" (1.638 m)    Wt 219 lb (99.3 kg)    LMP 02/03/2016 (Exact Date)    SpO2 98%    BMI 37.01 kg/m   Physical Exam Vitals reviewed.  Constitutional:      General: She is not in acute distress.    Appearance: Normal appearance. She is obese. She is not ill-appearing.  HENT:     Head: Normocephalic and atraumatic.  Cardiovascular:     Rate and Rhythm: Normal rate and regular rhythm.     Pulses: Normal pulses.     Heart sounds: Normal heart sounds. No murmur heard.   No friction rub. No gallop.  Pulmonary:     Effort: Pulmonary effort is normal. No respiratory distress.     Breath sounds: Normal breath sounds. No wheezing.  Skin:    General: Skin is warm and dry.  Neurological:     Mental Status: She is alert and oriented to person, place, and time.  Psychiatric:        Mood and Affect: Mood normal.        Behavior: Behavior normal.        Thought Content:  Thought content normal.        Judgment: Judgment normal.    Assessment & Plan:   1. Encounter to establish care Reviewed available information and discussed care concerns with patient.   2. Anxiety 3. Major depressive disorder with single episode, remission status unspecified Continue Wellbutrin as prescribed.  This is managed by her OB/GYN.  Reviewed Ativan and risks of dependence/tolerance.  She is aware of the risks of using this medication.  Advised that I would like  her to use this no more than 3-4 nights per week.  There is no Timespan on grief but would like to continually work toward limiting the use of this medication.  Patient verbalized understanding and was agreeable to the plan.  Continue counseling.  Outpatient Encounter Medications as of 11/09/2021  Medication Sig   buPROPion (WELLBUTRIN XL) 300 MG 24 hr tablet Take 1 tablet (300 mg total) by mouth daily.   estradiol (ESTRACE) 1 MG tablet Take 2 tablets ( 2 mg total) per day.   glucosamine-chondroitin 500-400 MG tablet Take 1 tablet by mouth daily.   Multiple Vitamin (MULTIVITAMIN) tablet Take 1 tablet by mouth daily.   nystatin (MYCOSTATIN/NYSTOP) powder Apply 1 application topically 3 (three) times daily. Apply to affected area for up to 7 days   [DISCONTINUED] LORazepam (ATIVAN) 0.5 MG tablet Take 1 mg by mouth.   LORazepam (ATIVAN) 1 MG tablet Take 0.5-1 tablets (0.5-1 mg total) by mouth daily as needed for anxiety.   [DISCONTINUED] ibuprofen (ADVIL,MOTRIN) 800 MG tablet Take 1 tablet (800 mg total) by mouth every 8 (eight) hours as needed for mild pain.   [DISCONTINUED] NON FORMULARY Take 1 tablet by mouth daily. Calm-aid   No facility-administered encounter medications on file as of 11/09/2021.    Follow-up: Return in about 6 months (around 05/09/2022) for mood follow up.   Clearnce Sorrel, DNP, APRN, FNP-BC [facility]

## 2022-05-09 NOTE — Progress Notes (Unsigned)
   Established Patient Office Visit  Subjective   Patient ID: Theresa Singleton, female   DOB: Jan 22, 1971 Age: 51 y.o. MRN: [id number]   No chief complaint on file.   HPI Pleasant 51 year old female presenting today for the following:  Mood:  Post hysterectomy:   Objective:    There were no vitals filed for this visit.  Physical Exam   No results found for this or any previous visit (from the past 24 hour(s)).   {Labs (Optional):23779}  The 10-year ASCVD risk score (Arnett DK, et al., 2019) is: 1.5%   Values used to calculate the score:     Age: 11 years     Sex: Female     Is Non-Hispanic African American: No     Diabetic: No     Tobacco smoker: No     Systolic Blood Pressure: 841 mmHg     Is BP treated: No     HDL Cholesterol: 68 mg/dL     Total Cholesterol: 191 mg/dL   Assessment & Plan:   No problem-specific Assessment & Plan notes found for this encounter.   No follow-ups on file.  ___________________________________________ Clearnce Sorrel, DNP, APRN, FNP-BC [facility]

## 2022-05-10 ENCOUNTER — Ambulatory Visit: Payer: Commercial Managed Care - PPO | Admitting: Medical-Surgical

## 2022-05-10 ENCOUNTER — Encounter: Payer: Self-pay | Admitting: Medical-Surgical

## 2022-05-10 VITALS — BP 124/86 | HR 92 | Resp 20 | Ht 64.5 in | Wt 205.7 lb

## 2022-05-10 DIAGNOSIS — F419 Anxiety disorder, unspecified: Secondary | ICD-10-CM | POA: Diagnosis not present

## 2022-05-10 DIAGNOSIS — Z23 Encounter for immunization: Secondary | ICD-10-CM | POA: Diagnosis not present

## 2022-05-10 DIAGNOSIS — F329 Major depressive disorder, single episode, unspecified: Secondary | ICD-10-CM | POA: Diagnosis not present

## 2022-05-11 ENCOUNTER — Other Ambulatory Visit: Payer: Self-pay | Admitting: *Deleted

## 2022-05-11 DIAGNOSIS — D751 Secondary polycythemia: Secondary | ICD-10-CM

## 2022-05-12 ENCOUNTER — Inpatient Hospital Stay: Payer: Commercial Managed Care - PPO | Attending: Hematology | Admitting: Hematology

## 2022-05-12 ENCOUNTER — Inpatient Hospital Stay: Payer: Commercial Managed Care - PPO

## 2022-05-12 ENCOUNTER — Other Ambulatory Visit: Payer: Self-pay

## 2022-05-12 VITALS — BP 146/79 | HR 84 | Temp 97.7°F | Resp 17 | Ht 64.5 in | Wt 209.6 lb

## 2022-05-12 DIAGNOSIS — D751 Secondary polycythemia: Secondary | ICD-10-CM

## 2022-05-12 LAB — RETICULOCYTES
Immature Retic Fract: 11.4 % (ref 2.3–15.9)
RBC.: 4.75 MIL/uL (ref 3.87–5.11)
Retic Count, Absolute: 68.4 10*3/uL (ref 19.0–186.0)
Retic Ct Pct: 1.4 % (ref 0.4–3.1)

## 2022-05-12 LAB — CMP (CANCER CENTER ONLY)
ALT: 21 U/L (ref 0–44)
AST: 26 U/L (ref 15–41)
Albumin: 3.7 g/dL (ref 3.5–5.0)
Alkaline Phosphatase: 66 U/L (ref 38–126)
Anion gap: 6 (ref 5–15)
BUN: 10 mg/dL (ref 6–20)
CO2: 23 mmol/L (ref 22–32)
Calcium: 9.1 mg/dL (ref 8.9–10.3)
Chloride: 111 mmol/L (ref 98–111)
Creatinine: 0.9 mg/dL (ref 0.44–1.00)
GFR, Estimated: >60 mL/min (ref >60)
Glucose, Bld: 100 mg/dL — ABNORMAL HIGH (ref 70–99)
Potassium: 4.2 mmol/L (ref 3.5–5.1)
Sodium: 140 mmol/L (ref 135–145)
Total Bilirubin: 0.5 mg/dL (ref 0.3–1.2)
Total Protein: 7.2 g/dL (ref 6.5–8.1)

## 2022-05-12 LAB — CBC WITH DIFFERENTIAL (CANCER CENTER ONLY)
Abs Immature Granulocytes: 0.01 10*3/uL (ref 0.00–0.07)
Basophils Absolute: 0 10*3/uL (ref 0.0–0.1)
Basophils Relative: 1 %
Eosinophils Absolute: 0.1 10*3/uL (ref 0.0–0.5)
Eosinophils Relative: 2 %
HCT: 45.8 % (ref 36.0–46.0)
Hemoglobin: 16 g/dL — ABNORMAL HIGH (ref 12.0–15.0)
Immature Granulocytes: 0 %
Lymphocytes Relative: 33 %
Lymphs Abs: 2.2 10*3/uL (ref 0.7–4.0)
MCH: 32.6 pg (ref 26.0–34.0)
MCHC: 34.9 g/dL (ref 30.0–36.0)
MCV: 93.3 fL (ref 80.0–100.0)
Monocytes Absolute: 0.5 10*3/uL (ref 0.1–1.0)
Monocytes Relative: 7 %
Neutro Abs: 3.8 10*3/uL (ref 1.7–7.7)
Neutrophils Relative %: 57 %
Platelet Count: 265 10*3/uL (ref 150–400)
RBC: 4.91 MIL/uL (ref 3.87–5.11)
RDW: 12.3 % (ref 11.5–15.5)
WBC Count: 6.6 10*3/uL (ref 4.0–10.5)
nRBC: 0 % (ref 0.0–0.2)

## 2022-05-12 LAB — FERRITIN: Ferritin: 51 ng/mL (ref 11–307)

## 2022-05-13 ENCOUNTER — Telehealth: Payer: Self-pay | Admitting: Hematology

## 2022-05-13 LAB — ERYTHROPOIETIN: Erythropoietin: 7.9 m[IU]/mL (ref 2.6–18.5)

## 2022-05-13 NOTE — Telephone Encounter (Signed)
Left message with follow-up appointment per 8/23 los. 

## 2022-05-19 NOTE — Progress Notes (Signed)
[name]   HEMATOLOGY/ONCOLOGY CLINIC NOTE  Date of Service: 05/12/2022  Patient Care Team: Samuel Bouche, NP as PCP - General (Nurse Practitioner)  CHIEF COMPLAINTS/PURPOSE OF CONSULTATION:  Follow-up for polycythemia  HISTORY OF PRESENTING ILLNESS:   Theresa Singleton is a wonderful 51 y.o. female who has been referred to [place] by Dr .Samuel Bouche, NP /Dr. Joline Maxcy MD for evaluation and management of polycythemia.  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  Normal WBC count of 6.5k and normal platelets of 289k.  Previous labs in our system in December 2021 showed a hemoglobin of 16.5 with a hematocrit of 47.7.  With normal WBC count and platelets. Patient notes no acute new symptoms.  No new headaches, leg swelling, shortness of breath or chest pain, vision difficulties.  She has had a previous total abdominal hysterectomy with left salpingo-oophorectomy in 2017.  Previous right oophorectomy in 2004.  Both of these were related to endometriosis.  Patient is on chronic estradiol replacement at 2 mg p.o. daily by her OB/GYN doctor.  She has no personal or family history of venous thromboembolism or arterial thrombosis. Family history of breast cancer in her mother.  She notes no other acute new focal symptoms.  INTERVAL HISTORY  Theresa Singleton is here for follow-up of her polycythemia.  She continues to be on her estrogen replacement.  Notes better hydration since her last visit.  Notes no acute new symptoms since her last clinic visit. Labs done today were discussed in detail with the patient.   MEDICAL HISTORY:  Past Medical History:  Diagnosis Date   Abnormal Pap smear of cervix 2016   CIN I   Amenorrhea    Anemia    Hx   Anxiety    Cervical stenosis (uterine cervix)    Depression    Diverticulitis     Endometriosis    Infertility, female    Seasonal allergies    SVD (spontaneous vaginal delivery)    x 1    SURGICAL HISTORY: Past Surgical History:  Procedure Laterality Date   ABDOMINAL HYSTERECTOMY N/A 02/17/2016   Procedure: HYSTERECTOMY TOTAL ABDOMINAL WITH LSO, EXTENSIVE LYSIS OF ADHESION AND POSSIBLE CYSTOSCOPY;  Surgeon: Nunzio Cobbs, MD;  Location: [facility];  Service: Gynecology;  Laterality: N/A;  LEFT SALPINGO OOPHORECTOMY and lysis of adhesions    COLONOSCOPY     CYSTOSCOPY N/A 02/17/2016   Procedure: CYSTOSCOPY;  Surgeon: Nunzio Cobbs, MD;  Location: [facility];  Service: Gynecology;  Laterality: N/A;   ENDOMETRIAL ABLATION  2003   HYSTEROSCOPY  2003   HYSTEROSCOPY WITH D & C N/A 12/09/2015   Procedure: DILATATION AND CURETTAGE /HYSTEROSCOPY;  Surgeon: Nunzio Cobbs, MD;  Location: [facility];  Service: Gynecology;  Laterality: N/A;   OOPHORECTOMY Right 06/2003    Endometriosis    PELVIC LAPAROSCOPY  2004   several. endometriosis    ROBOTIC ASSISTED SALPINGO OOPHERECTOMY Left 12/09/2015   Procedure: LAPAROSCOPY WITH PELVIC WASHINGS & LYSIS OF ADHESIONS;  Surgeon: Nunzio Cobbs, MD;  Location: [facility];  Service: Gynecology;  Laterality: Left;  Dr. Quincy Simmonds called at 6:45am to tell team not to drape robot out. The team had already set the room up and draped the robot. The case was posted for a robotic salpingo oophorectomy  SOCIAL HISTORY: Social History   Socioeconomic History   Marital status: Divorced    Spouse name: Not on file   Number of children: Not on file   Years of education: Not on file   Highest education level: Not on file  Occupational History   Occupation: Therapist  Tobacco Use   Smoking status: Never    Passive exposure: Never   Smokeless tobacco: Never  Vaping Use   Vaping Use: Never used  Substance and Sexual Activity   Alcohol use: Not Currently    Comment: 1 drink/month   Drug use: No   Sexual activity: Not  Currently    Partners: Male    Birth control/protection: Surgical    Comment: TAH/LSO  Other Topics Concern   Not on file  Social History Narrative   Not on file   Social Determinants of Health   Financial Resource Strain: Not on file  Food Insecurity: Not on file  Transportation Needs: Not on file  Physical Activity: Not on file  Stress: Not on file  Social Connections: Not on file  Intimate Partner Violence: Not on file    FAMILY HISTORY: Family History  Problem Relation Age of Onset   Breast cancer Mother 39   Diabetes Maternal Uncle    Diabetes Maternal Grandmother    Stroke Maternal Grandmother    Hypertension Maternal Grandmother    Diabetes Maternal Uncle    Lung disease Maternal Grandfather        related to working in saw [employer]    ALLERGIES:  is allergic to aspirin and codeine.  MEDICATIONS:  Current Outpatient Medications  Medication Sig Dispense Refill   buPROPion (WELLBUTRIN XL) 300 MG 24 hr tablet Take 1 tablet (300 mg total) by mouth daily. 30 tablet 11   estradiol (ESTRACE) 1 MG tablet Take 2 tablets ( 2 mg total) per day. 60 tablet 11   glucosamine-chondroitin 500-400 MG tablet Take 1 tablet by mouth daily.     LORazepam (ATIVAN) 1 MG tablet Take 0.5-1 tablets (0.5-1 mg total) by mouth daily as needed for anxiety. 30 tablet 4   Multiple Vitamin (MULTIVITAMIN) tablet Take 1 tablet by mouth daily.     nystatin (MYCOSTATIN/NYSTOP) powder Apply 1 application topically 3 (three) times daily. Apply to affected area for up to 7 days (Patient not taking: Reported on 05/12/2022) 15 g 2   No current facility-administered medications for this visit.    REVIEW OF SYSTEMS:   10 Point review of Systems was done is negative except as noted above.  PHYSICAL EXAMINATION: .BP (!) 146/79 (BP Location: Left Arm, Patient Position: Sitting) Comment: nurse notified  Pulse 84   Temp 97.7 F (36.5 C) (Temporal)   Resp 17   Ht 5' 4.5" (1.638 m)   Wt 209 lb 9.6 oz (95.1  kg)   LMP 02/03/2016 (Exact Date)   SpO2 100%   BMI 35.42 kg/m  . GENERAL:alert, in no acute distress and comfortable SKIN: no acute rashes, no significant lesions EYES: conjunctiva are pink and non-injected, sclera anicteric OROPHARYNX: MMM, no exudates, no oropharyngeal erythema or ulceration NECK: supple, no JVD LYMPH:  no palpable lymphadenopathy in the cervical, axillary or inguinal regions LUNGS: clear to auscultation b/l with normal respiratory effort HEART: regular rate & rhythm ABDOMEN:  normoactive bowel sounds , non tender, not distended. Extremity: no pedal edema PSYCH: alert & oriented x 3 with fluent speech NEURO: no focal motor/sensory deficits   LABORATORY DATA:  I have reviewed the data  as listed  .    Latest Ref Rng & Units 05/12/2022    1:59 PM 10/14/2021    3:39 PM 09/28/2021    2:51 PM  CBC  WBC 4.0 - 10.5 K/uL 6.6  7.2  6.5   Hemoglobin 12.0 - 15.0 g/dL 16.0  17.0  17.4   Hematocrit 36.0 - 46.0 % 45.8  48.9  51.2   Platelets 150 - 400 K/uL 265  257  289     .    Latest Ref Rng & Units 05/12/2022    1:59 PM 10/14/2021    3:39 PM 09/28/2021    2:51 PM  CMP  Glucose 70 - 99 mg/dL 100  94  96   BUN 6 - 20 mg/dL '10  8  7   '$ Creatinine 0.44 - 1.00 mg/dL 0.90  0.69  0.69   Sodium 135 - 145 mmol/L 140  139  140   Potassium 3.5 - 5.1 mmol/L 4.2  4.1  4.3   Chloride 98 - 111 mmol/L 111  106  109   CO2 22 - 32 mmol/L '23  25  21   '$ Calcium 8.9 - 10.3 mg/dL 9.1  9.3  9.4   Total Protein 6.5 - 8.1 g/dL 7.2  8.1  7.2   Total Bilirubin 0.3 - 1.2 mg/dL 0.5  0.7  0.7   Alkaline Phos 38 - 126 U/L 66  69    AST 15 - 41 U/L '26  25  21   '$ ALT 0 - 44 U/L '21  22  17    '$ Component     Latest Ref Rng & Units 10/14/2021  Retic Ct Pct     0.4 - 3.1 % 1.5  RBC.     3.87 - 5.11 MIL/uL 5.13 (H)  Retic Count, Absolute     19.0 - 186.0 K/uL 74.4  Immature Retic Fract     2.3 - 15.9 % 10.8  Erythropoietin     2.6 - 18.5 mIU/mL 7.1  Ferritin     11 - 307 ng/mL 79      RADIOGRAPHIC STUDIES: I have personally reviewed the radiological images as listed and agreed with the findings in the report. No results found.  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  17.4 with a hematocrit of 51.2. Patient has had no previous history of venous thromboembolism or arterial thrombosis. No other focal symptoms related to this currently. PLAN -Labs done today were discussed in detail with the patient. Labs today show no evidence of polycythemia.  This seems to have resolved with better hydration. We discussed that her polycythemia is likely secondary given absence of clonal mutations and resolution with better hydration. No indication for additional hematologic work-up or hematologic intervention such as therapeutic phlebotomy at this time.  Follow-up Return to clinic as needed with Dr. Irene Limbo.  The total time spent in the appointment was 15 minutes*.  All of the patient's questions were answered with apparent satisfaction. The patient knows to call the clinic with any problems, questions or concerns.   Sullivan Lone MD MS AAHIVMS [facility] Hematology/Oncology Physician [facility]  .*Total Encounter Time as defined by the Centers for Medicare and Medicaid Services includes, in addition to the face-to-face time of a patient visit (documented in the note above) non-face-to-face time: obtaining and reviewing outside history, ordering and reviewing medications, tests or procedures, care coordination (communications with other health  care professionals or caregivers) and documentation in the medical record.

## 2022-06-13 ENCOUNTER — Other Ambulatory Visit: Payer: Self-pay | Admitting: Medical-Surgical

## 2022-06-14 ENCOUNTER — Other Ambulatory Visit: Payer: Self-pay

## 2022-06-14 MED ORDER — LORAZEPAM 1 MG PO TABS: ORAL_TABLET | ORAL | 0 refills | Status: DC

## 2022-07-09 ENCOUNTER — Other Ambulatory Visit: Payer: Self-pay | Admitting: Obstetrics and Gynecology

## 2022-07-09 DIAGNOSIS — Z1231 Encounter for screening mammogram for malignant neoplasm of breast: Secondary | ICD-10-CM

## 2022-08-30 ENCOUNTER — Ambulatory Visit
Admission: RE | Admit: 2022-08-30 | Discharge: 2022-08-30 | Disposition: A | Discharge status: Home / Self Care | Payer: Commercial Managed Care - PPO | Source: Ambulatory Visit

## 2022-08-30 DIAGNOSIS — Z1231 Encounter for screening mammogram for malignant neoplasm of breast: Secondary | ICD-10-CM

## 2022-09-21 NOTE — Progress Notes (Signed)
52 y.o. G18P1001 Divorced Caucasian female here for annual exam.    Has hot flashes if she misses a dosage.  Wants to continue her estrogen therapy.  Taking Wellbutrin XL 300 mg for depression.  Doing ok.  Working.  Able to experience some joy.   Denies suicidal ideation.  Has a good support system.   Wants cholesterol check.   PCP:   Samuel Bouche, NP  Patient's last menstrual period was 02/03/2016 (exact date).           Sexually active: No.  The current method of family planning is status post hysterectomy.    Exercising: Yes.     Walking. Smoker:  no  Health Maintenance: Pap:  07-21-15 Neg:Neg HR HPV, 02-24-15 Neg, 07-15-14 Neg  History of abnormal Pap:  yes, CIN1 2016. 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 Pathology showed koilocytic atypia and endometriosis and ECC showed acute and chronic inflammation. Pap 10/17/13 - LGSIL and negative HR HPV, Pap 03/19/14 - Neg and meg HR HPV, Pap 07/15/14 - negative and no high risk HPV testing, Pap 01/20/15 - negative but no endocervical cells and no high risk HPV testing, Pap 02/24/15 - negative pap and no HR HPV testing done.  Specimen was adequate.   Hysterectomy 2017 with final pathology - benign cervix.  MMG:  08/30/22 Breast Density Category B, BI-RADS CATEGORY 1 Negative Colonoscopy:  2014, next 10 years BMD:   n/a  Result  n/a TDaP:  2014 Gardasil:   no HIV:08/28/18 NR Hep C: 08/28/18 Neg Screening Labs:  cholesterol today   reports that she has never smoked. She has never been exposed to tobacco smoke. She has never used smokeless tobacco. She reports that she does not currently use alcohol. She reports that she does not use drugs.  Past Medical History:  Diagnosis Date   Abnormal Pap smear of cervix 2016   CIN I   Amenorrhea    Anemia    Hx   Anxiety    Cervical stenosis (uterine cervix)    Depression    Diverticulitis    Endometriosis    Infertility, female    Seasonal allergies    SVD (spontaneous vaginal  delivery)    x 1    Past Surgical History:  Procedure Laterality Date   ABDOMINAL HYSTERECTOMY N/A 02/17/2016   Procedure: HYSTERECTOMY TOTAL ABDOMINAL WITH LSO, EXTENSIVE LYSIS OF ADHESION AND POSSIBLE CYSTOSCOPY;  Surgeon: Nunzio Cobbs, MD;  Location: [facility];  Service: Gynecology;  Laterality: N/A;  LEFT SALPINGO OOPHORECTOMY and lysis of adhesions    COLONOSCOPY     CYSTOSCOPY N/A 02/17/2016   Procedure: CYSTOSCOPY;  Surgeon: Nunzio Cobbs, MD;  Location: [facility];  Service: Gynecology;  Laterality: N/A;   ENDOMETRIAL ABLATION  2003   HYSTEROSCOPY  2003   HYSTEROSCOPY WITH D & C N/A 12/09/2015   Procedure: DILATATION AND CURETTAGE /HYSTEROSCOPY;  Surgeon: Nunzio Cobbs, MD;  Location: [facility];  Service: Gynecology;  Laterality: N/A;   OOPHORECTOMY Right 06/2003    Endometriosis    PELVIC LAPAROSCOPY  2004   several. endometriosis    ROBOTIC ASSISTED SALPINGO OOPHERECTOMY Left 12/09/2015   Procedure: LAPAROSCOPY WITH PELVIC WASHINGS & LYSIS OF ADHESIONS;  Surgeon: Nunzio Cobbs, MD;  Location: [facility];  Service: Gynecology;  Laterality: Left;  Dr. Quincy Simmonds called at 6:45am to tell team not to drape robot out. The team had already set the room up and draped  the robot. The case was posted for a robotic salpingo oophorectomy    Current Outpatient Medications  Medication Sig Dispense Refill   glucosamine-chondroitin 500-400 MG tablet Take 1 tablet by mouth daily.     LORazepam (ATIVAN) 1 MG tablet TAKE ONE-HALF TO ONE TABLET BY MOUTH ONCE DAILY AS NEEDED FOR ANXIETY 30 tablet 0   Multiple Vitamin (MULTIVITAMIN) tablet Take 1 tablet by mouth daily.     nystatin (MYCOSTATIN/NYSTOP) powder Apply 1 application topically 3 (three) times daily. Apply to affected area for up to 7 days 15 g 2   buPROPion (WELLBUTRIN XL) 300 MG 24 hr tablet Take 1 tablet (300 mg total) by mouth daily. 30 tablet 11   estradiol (ESTRACE) 1 MG tablet Take 2 tablets ( 2 mg total) per  day. 60 tablet 11   No current facility-administered medications for this visit.    Family History  Problem Relation Age of Onset   Breast cancer Mother 23   Diabetes Maternal Uncle    Diabetes Maternal Grandmother    Stroke Maternal Grandmother    Hypertension Maternal Grandmother    Diabetes Maternal Uncle    Lung disease Maternal Grandfather        related to working in saw mill    Review of Systems  All other systems reviewed and are negative.   Exam:   BP 120/82 (BP Location: Right Arm, Patient Position: Sitting, Cuff Size: Large)   Pulse 84   Ht '5\' 4"'$  (1.626 m)   Wt 218 lb (98.9 kg)   LMP 02/03/2016 (Exact Date)   SpO2 98%   BMI 37.42 kg/m     General appearance: alert, cooperative and appears stated age Head: normocephalic, without obvious abnormality, atraumatic Neck: no adenopathy, supple, symmetrical, trachea midline and thyroid normal to inspection and palpation Lungs: clear to auscultation bilaterally Breasts: normal appearance, no masses or tenderness, No nipple retraction or dimpling, No nipple discharge or bleeding, No axillary adenopathy Heart: regular rate and rhythm Abdomen: soft, non-tender; no masses, no organomegaly Extremities: extremities normal, atraumatic, no cyanosis or edema Skin: skin color, texture, turgor normal. No rashes or lesions Lymph nodes: cervical, supraclavicular, and axillary nodes normal. Neurologic: grossly normal  Pelvic: External genitalia:  no lesions              No abnormal inguinal nodes palpated.              Urethra:  normal appearing urethra with no masses, tenderness or lesions              Bartholins and Skenes: normal                 Vagina: normal appearing vagina with normal color and discharge, no lesions              Cervix: no lesions              Pap taken: no Bimanual Exam:  Uterus:  absent              Adnexa: no mass, fullness, tenderness              Rectal exam: yes.  Confirms.              Anus:   normal sphincter tone, no lesions  Chaperone was present for exam:  Emily  Assessment:   Well woman visit with gynecologic exam. Status post TAH/left salpingo-oophorectomy.  Status post prior right salpingo-oophorectomy.  Hx endometriosis. ERT. Depression.  On  Wellbutrin XL.  FH breast cancer in mother.  Colon cancer screening.   Plan: Mammogram screening discussed. Self breast awareness reviewed. Pap and HR HPV as above. Guidelines for Calcium, Vitamin D, regular exercise program including cardiovascular and weight bearing exercise. We discussed the WHI and increased risk of stroke, PE, and DVT with use of ERT.  Rx for Estrace 20 mg daily.  #60, RF 11.  Rx for Wellbutrin XL 300 mg daily. #30, RF 11.  TDap.  Check cholesterol today.  Cologuard ordered.   Follow up annually and prn.   After visit summary provided.

## 2022-10-04 ENCOUNTER — Encounter: Payer: Self-pay | Admitting: Obstetrics and Gynecology

## 2022-10-04 ENCOUNTER — Ambulatory Visit (INDEPENDENT_AMBULATORY_CARE_PROVIDER_SITE_OTHER): Payer: Commercial Managed Care - PPO | Admitting: Obstetrics and Gynecology

## 2022-10-04 VITALS — BP 120/82 | HR 84 | Ht 64.0 in | Wt 218.0 lb

## 2022-10-04 DIAGNOSIS — Z0000 Encounter for general adult medical examination without abnormal findings: Secondary | ICD-10-CM

## 2022-10-04 DIAGNOSIS — Z23 Encounter for immunization: Secondary | ICD-10-CM | POA: Diagnosis not present

## 2022-10-04 DIAGNOSIS — Z1211 Encounter for screening for malignant neoplasm of colon: Secondary | ICD-10-CM

## 2022-10-04 DIAGNOSIS — Z01419 Encounter for gynecological examination (general) (routine) without abnormal findings: Secondary | ICD-10-CM

## 2022-10-04 MED ORDER — ESTRADIOL 1 MG PO TABS: ORAL_TABLET | ORAL | 11 refills | Status: DC

## 2022-10-04 MED ORDER — BUPROPION HCL ER (XL) 300 MG PO TB24: 300.0000 mg | ORAL_TABLET | Freq: Every day | ORAL | 11 refills | Status: DC

## 2022-10-04 NOTE — Patient Instructions (Signed)

## 2022-10-05 LAB — LIPID PANEL
Cholesterol: 194 mg/dL (ref <200)
HDL: 72 mg/dL (ref >=50)
LDL Cholesterol (Calc): 101 mg/dL (calc) — ABNORMAL HIGH
Non-HDL Cholesterol (Calc): 122 mg/dL (calc) (ref <130)
Total CHOL/HDL Ratio: 2.7 (calc) (ref <5.0)
Triglycerides: 115 mg/dL (ref <150)

## 2022-10-21 DIAGNOSIS — R195 Other fecal abnormalities: Secondary | ICD-10-CM

## 2022-10-21 HISTORY — DX: Other fecal abnormalities: R19.5

## 2022-11-10 ENCOUNTER — Other Ambulatory Visit: Payer: Self-pay

## 2022-11-10 DIAGNOSIS — D751 Secondary polycythemia: Secondary | ICD-10-CM

## 2022-11-11 ENCOUNTER — Encounter: Payer: Self-pay | Admitting: Obstetrics and Gynecology

## 2022-11-11 LAB — COLOGUARD: COLOGUARD: POSITIVE — AB

## 2022-11-12 ENCOUNTER — Telehealth: Payer: Self-pay | Admitting: Hematology

## 2022-11-12 ENCOUNTER — Inpatient Hospital Stay: Payer: Commercial Managed Care - PPO | Admitting: Hematology

## 2022-11-12 ENCOUNTER — Inpatient Hospital Stay: Payer: Commercial Managed Care - PPO

## 2022-11-12 NOTE — Progress Notes (Incomplete)
[name]   HEMATOLOGY/ONCOLOGY CLINIC NOTE  Date of Service: 05/12/2022  Patient Care Team: Samuel Bouche, NP as PCP - General (Nurse Practitioner)  CHIEF COMPLAINTS/PURPOSE OF CONSULTATION:  Follow-up for polycythemia  HISTORY OF PRESENTING ILLNESS:   Theresa Singleton is a wonderful 52 y.o. female who has been referred to [place] by Dr .Samuel Bouche, NP /Dr. Joline Maxcy MD for evaluation and management of polycythemia.  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  Normal WBC count of 6.5k and normal platelets of 289k.  Previous labs in our system in December 2021 showed a hemoglobin of 16.5 with a hematocrit of 47.7.  With normal WBC count and platelets. Patient notes no acute new symptoms.  No new headaches, leg swelling, shortness of breath or chest pain, vision difficulties.  She has had a previous total abdominal hysterectomy with left salpingo-oophorectomy in 2017.  Previous right oophorectomy in 2004.  Both of these were related to endometriosis.  Patient is on chronic estradiol replacement at 2 mg p.o. daily by her OB/GYN doctor.  She has no personal or family history of venous thromboembolism or arterial thrombosis. Family history of breast cancer in her mother.  She notes no other acute new focal symptoms.  INTERVAL HISTORY  ICIS STRATIS is here for follow-up of her polycythemia.  She continues to be on her estrogen replacement.  Notes better hydration since her last visit.  Notes no acute new symptoms since her last clinic visit. Labs done today were discussed in detail with the patient.   MEDICAL HISTORY:  Past Medical History:  Diagnosis Date   Abnormal Pap smear of cervix 09/20/2014   CIN I   Amenorrhea    Anemia    Hx   Anxiety    Cervical stenosis (uterine cervix)    Depression     Diverticulitis    Endometriosis    Infertility, female    Positive colorectal cancer screening using Cologuard test 10/2022   Seasonal allergies    SVD (spontaneous vaginal delivery)    x 1    SURGICAL HISTORY: Past Surgical History:  Procedure Laterality Date   ABDOMINAL HYSTERECTOMY N/A 02/17/2016   Procedure: HYSTERECTOMY TOTAL ABDOMINAL WITH LSO, EXTENSIVE LYSIS OF ADHESION AND POSSIBLE CYSTOSCOPY;  Surgeon: Nunzio Cobbs, MD;  Location: [facility];  Service: Gynecology;  Laterality: N/A;  LEFT SALPINGO OOPHORECTOMY and lysis of adhesions    COLONOSCOPY     CYSTOSCOPY N/A 02/17/2016   Procedure: CYSTOSCOPY;  Surgeon: Nunzio Cobbs, MD;  Location: [facility];  Service: Gynecology;  Laterality: N/A;   ENDOMETRIAL ABLATION  2003   HYSTEROSCOPY  2003   HYSTEROSCOPY WITH D & C N/A 12/09/2015   Procedure: DILATATION AND CURETTAGE /HYSTEROSCOPY;  Surgeon: Nunzio Cobbs, MD;  Location: [facility];  Service: Gynecology;  Laterality: N/A;   OOPHORECTOMY Right 06/2003    Endometriosis    PELVIC LAPAROSCOPY  2004   several. endometriosis    ROBOTIC ASSISTED SALPINGO OOPHERECTOMY Left 12/09/2015   Procedure: LAPAROSCOPY WITH PELVIC WASHINGS & LYSIS OF ADHESIONS;  Surgeon: Nunzio Cobbs, MD;  Location: [facility];  Service: Gynecology;  Laterality: Left;  Dr. Quincy Simmonds called at 6:45am to tell team not to drape robot out. The team had already set the room up and draped the robot.  The case was posted for a robotic salpingo oophorectomy    SOCIAL HISTORY: Social History   Socioeconomic History   Marital status: Divorced    Spouse name: Not on file   Number of children: Not on file   Years of education: Not on file   Highest education level: Not on file  Occupational History   Occupation: Therapist  Tobacco Use   Smoking status: Never    Passive exposure: Never   Smokeless tobacco: Never  Vaping Use   Vaping Use: Never used  Substance and Sexual Activity    Alcohol use: Not Currently    Comment: 1 drink/month   Drug use: No   Sexual activity: Not Currently    Partners: Male    Birth control/protection: Surgical    Comment: TAH/LSO  Other Topics Concern   Not on file  Social History Narrative   Not on file   Social Determinants of Health   Financial Resource Strain: Not on file  Food Insecurity: Not on file  Transportation Needs: Not on file  Physical Activity: Not on file  Stress: Not on file  Social Connections: Not on file  Intimate Partner Violence: Not on file    FAMILY HISTORY: Family History  Problem Relation Age of Onset   Breast cancer Mother 72   Diabetes Maternal Uncle    Diabetes Maternal Grandmother    Stroke Maternal Grandmother    Hypertension Maternal Grandmother    Diabetes Maternal Uncle    Lung disease Maternal Grandfather        related to working in saw mill    ALLERGIES:  is allergic to aspirin and codeine.  MEDICATIONS:  Current Outpatient Medications  Medication Sig Dispense Refill   buPROPion (WELLBUTRIN XL) 300 MG 24 hr tablet Take 1 tablet (300 mg total) by mouth daily. 30 tablet 11   estradiol (ESTRACE) 1 MG tablet Take 2 tablets ( 2 mg total) per day. 60 tablet 11   glucosamine-chondroitin 500-400 MG tablet Take 1 tablet by mouth daily.     LORazepam (ATIVAN) 1 MG tablet TAKE ONE-HALF TO ONE TABLET BY MOUTH ONCE DAILY AS NEEDED FOR ANXIETY 30 tablet 0   Multiple Vitamin (MULTIVITAMIN) tablet Take 1 tablet by mouth daily.     nystatin (MYCOSTATIN/NYSTOP) powder Apply 1 application topically 3 (three) times daily. Apply to affected area for up to 7 days 15 g 2   No current facility-administered medications for this visit.    REVIEW OF SYSTEMS:   10 Point review of Systems was done is negative except as noted above.  PHYSICAL EXAMINATION: .LMP 02/03/2016 (Exact Date)  . GENERAL:alert, in no acute distress and comfortable SKIN: no acute rashes, no significant lesions EYES: conjunctiva  are pink and non-injected, sclera anicteric OROPHARYNX: MMM, no exudates, no oropharyngeal erythema or ulceration NECK: supple, no JVD LYMPH:  no palpable lymphadenopathy in the cervical, axillary or inguinal regions LUNGS: clear to auscultation b/l with normal respiratory effort HEART: regular rate & rhythm ABDOMEN:  normoactive bowel sounds , non tender, not distended. Extremity: no pedal edema PSYCH: alert & oriented x 3 with fluent speech NEURO: no focal motor/sensory deficits   LABORATORY DATA:  I have reviewed the data as listed  .    Latest Ref Rng & Units 05/12/2022    1:59 PM 10/14/2021    3:39 PM 09/28/2021    2:51 PM  CBC  WBC 4.0 - 10.5 K/uL 6.6  7.2  6.5   Hemoglobin 12.0 - 15.0  g/dL 16.0  17.0  17.4   Hematocrit 36.0 - 46.0 % 45.8  48.9  51.2   Platelets 150 - 400 K/uL 265  257  289     .    Latest Ref Rng & Units 05/12/2022    1:59 PM 10/14/2021    3:39 PM 09/28/2021    2:51 PM  CMP  Glucose 70 - 99 mg/dL 100  94  96   BUN 6 - 20 mg/dL '10  8  7   '$ Creatinine 0.44 - 1.00 mg/dL 0.90  0.69  0.69   Sodium 135 - 145 mmol/L 140  139  140   Potassium 3.5 - 5.1 mmol/L 4.2  4.1  4.3   Chloride 98 - 111 mmol/L 111  106  109   CO2 22 - 32 mmol/L '23  25  21   '$ Calcium 8.9 - 10.3 mg/dL 9.1  9.3  9.4   Total Protein 6.5 - 8.1 g/dL 7.2  8.1  7.2   Total Bilirubin 0.3 - 1.2 mg/dL 0.5  0.7  0.7   Alkaline Phos 38 - 126 U/L 66  69    AST 15 - 41 U/L '26  25  21   '$ ALT 0 - 44 U/L '21  22  17    '$ Component     Latest Ref Rng & Units 10/14/2021  Retic Ct Pct     0.4 - 3.1 % 1.5  RBC.     3.87 - 5.11 MIL/uL 5.13 (H)  Retic Count, Absolute     19.0 - 186.0 K/uL 74.4  Immature Retic Fract     2.3 - 15.9 % 10.8  Erythropoietin     2.6 - 18.5 mIU/mL 7.1  Ferritin     11 - 307 ng/mL 79     RADIOGRAPHIC STUDIES: I have personally reviewed the radiological images as listed and agreed with the findings in the report. No results found.  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  17.4 with a hematocrit of 51.2. Patient has had no previous history of venous thromboembolism or arterial thrombosis. No other focal symptoms related to this currently. PLAN -Labs done today were discussed in detail with the patient. Labs today show no evidence of polycythemia.  This seems to have resolved with better hydration. We discussed that her polycythemia is likely secondary given absence of clonal mutations and resolution with better hydration. No indication for additional hematologic work-up or hematologic intervention such as therapeutic phlebotomy at this time.  Follow-up Return to clinic as needed with Dr. Irene Limbo.  The total time spent in the appointment was 15 minutes*.  All of the patient's questions were answered with apparent satisfaction. The patient knows to call the clinic with any problems, questions or concerns.   Sullivan Lone MD MS AAHIVMS [facility] Hematology/Oncology Physician [facility]  .*Total Encounter Time as defined by the Centers for Medicare and Medicaid Services includes, in addition to the face-to-face time of a patient visit (documented in the note above) non-face-to-face time: obtaining and reviewing outside history, ordering and reviewing medications, tests or procedures, care coordination (communications with other health care professionals or caregivers) and documentation in the medical record.

## 2022-11-12 NOTE — Telephone Encounter (Signed)
Patient called due to not being notified of appointment prior to appointment date. Rescheduled patient appointment for 1 month out. Patient notified.

## 2022-11-15 ENCOUNTER — Ambulatory Visit (INDEPENDENT_AMBULATORY_CARE_PROVIDER_SITE_OTHER): Payer: Commercial Managed Care - PPO | Admitting: Medical-Surgical

## 2022-11-15 ENCOUNTER — Encounter: Payer: Self-pay | Admitting: Medical-Surgical

## 2022-11-15 VITALS — BP 127/81 | HR 91 | Resp 20 | Ht 64.0 in | Wt 220.5 lb

## 2022-11-15 DIAGNOSIS — F410 Panic disorder [episodic paroxysmal anxiety] without agoraphobia: Secondary | ICD-10-CM | POA: Diagnosis not present

## 2022-11-15 DIAGNOSIS — K5792 Diverticulitis of intestine, part unspecified, without perforation or abscess without bleeding: Secondary | ICD-10-CM | POA: Insufficient documentation

## 2022-11-15 MED ORDER — LORAZEPAM 1 MG PO TABS: ORAL_TABLET | ORAL | 1 refills | Status: DC

## 2022-11-15 NOTE — Progress Notes (Signed)
   Established Patient Office Visit  Subjective   Patient ID: Theresa Singleton, female   DOB: 1971-08-24 Age: 53 y.o. MRN: [id number]   Chief Complaint  Patient presents with   Follow-up   MOOD   HPI Pleasant 52 year old female presenting today for mood follow-up.  She is currently taking Wellbutrin 300 mg daily that is prescribed by her OB/GYN.  Feels medication is working well for her.  She also uses lorazepam 0.5-1 mg daily as needed for severe anxiety and panic attacks.  Reports that her dose depends greatly on her symptoms.  Averaging 1-2 doses per week.  Feels that she is taking it more often since she just joined a grief support group and is doing group counseling.  Has SI/HI.   Objective:    Vitals:   11/15/22 1332  BP: 127/81  Pulse: 91  Resp: 20  Height: 5' 4"$  (1.626 m)  Weight: 220 lb 8 oz (100 kg)  SpO2: 99%  BMI (Calculated): 37.83    Physical Exam Vitals reviewed.  Constitutional:      General: She is not in acute distress.    Appearance: Normal appearance. She is not ill-appearing.  HENT:     Head: Normocephalic and atraumatic.  Cardiovascular:     Rate and Rhythm: Normal rate and regular rhythm.     Pulses: Normal pulses.     Heart sounds: Normal heart sounds.  Pulmonary:     Effort: Pulmonary effort is normal. No respiratory distress.     Breath sounds: Normal breath sounds. No wheezing, rhonchi or rales.  Skin:    General: Skin is warm and dry.  Neurological:     Mental Status: She is alert and oriented to person, place, and time.  Psychiatric:        Mood and Affect: Mood normal.        Behavior: Behavior normal.        Thought Content: Thought content normal.        Judgment: Judgment normal.   No results found for this or any previous visit (from the past 24 hour(s)).     The 10-year ASCVD risk score (Arnett DK, et al., 2019) is: 1%   Values used to calculate the score:     Age: 12 years     Sex: Female     Is Non-Hispanic African  American: No     Diabetic: No     Tobacco smoker: No     Systolic Blood Pressure: [id number] mmHg     Is BP treated: No     HDL Cholesterol: 72 mg/dL     Total Cholesterol: 194 mg/dL   Assessment & Plan:   1. Panic disorder Continue Wellbutrin 300 mg daily.  Continue very sparing use of lorazepam 0.5-1 mg daily as needed.  Would like to continue use less than 3 times weekly and only for severe anxiety that does not respond well to other measures.  Return in about 6 months (around 05/16/2023) for mood follow up.  ___________________________________________ Clearnce Sorrel, DNP, APRN, FNP-BC [facility] [place]

## 2022-11-16 ENCOUNTER — Encounter: Payer: Self-pay | Admitting: Obstetrics and Gynecology

## 2022-11-19 ENCOUNTER — Encounter: Payer: Self-pay | Admitting: Obstetrics and Gynecology

## 2023-01-10 ENCOUNTER — Inpatient Hospital Stay (HOSPITAL_BASED_OUTPATIENT_CLINIC_OR_DEPARTMENT_OTHER): Payer: Commercial Managed Care - PPO | Admitting: Hematology

## 2023-01-10 ENCOUNTER — Inpatient Hospital Stay: Payer: Commercial Managed Care - PPO | Attending: Hematology

## 2023-01-10 VITALS — BP 127/71 | HR 90 | Temp 97.9°F | Resp 20 | Wt 225.8 lb

## 2023-01-10 DIAGNOSIS — D751 Secondary polycythemia: Secondary | ICD-10-CM | POA: Diagnosis present

## 2023-01-10 DIAGNOSIS — Z8249 Family history of ischemic heart disease and other diseases of the circulatory system: Secondary | ICD-10-CM | POA: Diagnosis not present

## 2023-01-10 DIAGNOSIS — Z833 Family history of diabetes mellitus: Secondary | ICD-10-CM | POA: Diagnosis not present

## 2023-01-10 DIAGNOSIS — Z803 Family history of malignant neoplasm of breast: Secondary | ICD-10-CM | POA: Diagnosis not present

## 2023-01-10 DIAGNOSIS — Z825 Family history of asthma and other chronic lower respiratory diseases: Secondary | ICD-10-CM | POA: Diagnosis not present

## 2023-01-10 DIAGNOSIS — Z79899 Other long term (current) drug therapy: Secondary | ICD-10-CM | POA: Insufficient documentation

## 2023-01-10 DIAGNOSIS — Z823 Family history of stroke: Secondary | ICD-10-CM | POA: Diagnosis not present

## 2023-01-10 LAB — CBC WITH DIFFERENTIAL (CANCER CENTER ONLY)
Abs Immature Granulocytes: 0.02 10*3/uL (ref 0.00–0.07)
Basophils Absolute: 0.1 10*3/uL (ref 0.0–0.1)
Basophils Relative: 1 %
Eosinophils Absolute: 0.1 10*3/uL (ref 0.0–0.5)
Eosinophils Relative: 1 %
HCT: 47.7 % — ABNORMAL HIGH (ref 36.0–46.0)
Hemoglobin: 16.5 g/dL — ABNORMAL HIGH (ref 12.0–15.0)
Immature Granulocytes: 0 %
Lymphocytes Relative: 23 %
Lymphs Abs: 1.9 10*3/uL (ref 0.7–4.0)
MCH: 33.1 pg (ref 26.0–34.0)
MCHC: 34.6 g/dL (ref 30.0–36.0)
MCV: 95.8 fL (ref 80.0–100.0)
Monocytes Absolute: 0.5 10*3/uL (ref 0.1–1.0)
Monocytes Relative: 6 %
Neutro Abs: 5.8 10*3/uL (ref 1.7–7.7)
Neutrophils Relative %: 69 %
Platelet Count: 273 10*3/uL (ref 150–400)
RBC: 4.98 MIL/uL (ref 3.87–5.11)
RDW: 12.1 % (ref 11.5–15.5)
WBC Count: 8.4 10*3/uL (ref 4.0–10.5)
nRBC: 0 % (ref 0.0–0.2)

## 2023-01-10 LAB — CMP (CANCER CENTER ONLY)
ALT: 22 U/L (ref 0–44)
AST: 26 U/L (ref 15–41)
Albumin: 3.8 g/dL (ref 3.5–5.0)
Alkaline Phosphatase: 69 U/L (ref 38–126)
Anion gap: 6 (ref 5–15)
BUN: 12 mg/dL (ref 6–20)
CO2: 26 mmol/L (ref 22–32)
Calcium: 9.1 mg/dL (ref 8.9–10.3)
Chloride: 109 mmol/L (ref 98–111)
Creatinine: 0.88 mg/dL (ref 0.44–1.00)
GFR, Estimated: >60 mL/min (ref >60)
Glucose, Bld: 95 mg/dL (ref 70–99)
Potassium: 4.2 mmol/L (ref 3.5–5.1)
Sodium: 141 mmol/L (ref 135–145)
Total Bilirubin: 0.5 mg/dL (ref 0.3–1.2)
Total Protein: 7.4 g/dL (ref 6.5–8.1)

## 2023-01-10 LAB — FERRITIN: Ferritin: 30 ng/mL (ref 11–307)

## 2023-01-10 LAB — RETICULOCYTES
Immature Retic Fract: 11.9 % (ref 2.3–15.9)
RBC.: 4.94 MIL/uL (ref 3.87–5.11)
Retic Count, Absolute: 81 10*3/uL (ref 19.0–186.0)
Retic Ct Pct: 1.6 % (ref 0.4–3.1)

## 2023-01-10 NOTE — Progress Notes (Signed)
Theresa Singleton   HEMATOLOGY/ONCOLOGY CLINIC NOTE  Date of Service: 01/10/23  Patient Care Team: Christen Butter, NP as PCP - General (Nurse Practitioner)  CHIEF COMPLAINTS/PURPOSE OF CONSULTATION:  Follow-up for polycythemia  HISTORY OF PRESENTING ILLNESS:   Theresa Singleton is a wonderful 52 y.o. female who has been referred to [place] by Dr .Christen Butter, NP /Dr. Lavena Bullion MD for evaluation and management of polycythemia.  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  Normal WBC count of 6.5k and normal platelets of 289k.  Previous labs in our system in December 2021 showed a hemoglobin of 16.5 with a hematocrit of 47.7.  With normal WBC count and platelets. Patient notes no acute new symptoms.  No new headaches, leg swelling, shortness of breath or chest pain, vision difficulties.  She has had a previous total abdominal hysterectomy with left salpingo-oophorectomy in 2017.  Previous right oophorectomy in 2004.  Both of these were related to endometriosis.  Patient is on chronic estradiol replacement at 2 mg p.o. daily by her OB/GYN doctor.  She has no personal or family history of venous thromboembolism or arterial thrombosis. Family history of breast cancer in her mother.  She notes no other acute new focal symptoms.  INTERVAL HISTORY  Theresa Singleton is here for follow-up of her polycythemia.  She continues to be on her estrogen replacement.   Patient was last seen by me on 05/12/2022 and she was doing well overall.   Patient notes she has been doing well overall since our last visit. She denies fever, chills, night sweats, infections issues, unexpected weight loss, back pain, abnormal bowel movement, abdominal pain, chest pain, or leg swelling.   Patient notes she has colonoscopy scheduled in June in [place].   She  notes she has been eating well and staying well hydrated.   Patient notes that she lost her son around one year ago. She has joined a grief support group since our last visit. She is taking Wellbutrin 300 mg daily, which has been helping her symptoms.   MEDICAL HISTORY:  Past Medical History:  Diagnosis Date   Abnormal Pap smear of cervix 09/20/2014   CIN I   Amenorrhea    Anemia    Hx   Anxiety    Cervical stenosis (uterine cervix)    Depression    Diverticulitis    Endometriosis    Infertility, female    Positive colorectal cancer screening using Cologuard test 10/2022   Needs colonoscopy.   Seasonal allergies    SVD (spontaneous vaginal delivery)    x 1    SURGICAL HISTORY: Past Surgical History:  Procedure Laterality Date   ABDOMINAL HYSTERECTOMY N/A 02/17/2016   Procedure: HYSTERECTOMY TOTAL ABDOMINAL WITH LSO, EXTENSIVE LYSIS OF ADHESION AND POSSIBLE CYSTOSCOPY;  Surgeon: Patton Salles, MD;  Location: [facility];  Service: Gynecology;  Laterality: N/A;  LEFT SALPINGO OOPHORECTOMY and lysis of adhesions    COLONOSCOPY     CYSTOSCOPY N/A 02/17/2016   Procedure: CYSTOSCOPY;  Surgeon: Patton Salles, MD;  Location: [facility];  Service: Gynecology;  Laterality: N/A;   ENDOMETRIAL ABLATION  2003   HYSTEROSCOPY  2003   HYSTEROSCOPY WITH D & C N/A 12/09/2015   Procedure: DILATATION AND CURETTAGE /HYSTEROSCOPY;  Surgeon: Patton Salles, MD;  Location: [facility];  Service: Gynecology;  Laterality: N/A;   OOPHORECTOMY Right 06/2003    Endometriosis    PELVIC LAPAROSCOPY  2004   several. endometriosis    ROBOTIC ASSISTED SALPINGO OOPHERECTOMY Left 12/09/2015   Procedure: LAPAROSCOPY WITH PELVIC WASHINGS & LYSIS OF ADHESIONS;  Surgeon: Patton Salles, MD;  Location: [facility];  Service: Gynecology;  Laterality: Left;  Dr. Edward Jolly called at 6:45am to tell team not to drape robot out. The team had already set the room up and draped the robot. The case was posted  for a robotic salpingo oophorectomy    SOCIAL HISTORY: Social History   Socioeconomic History   Marital status: Divorced    Spouse name: Not on file   Number of children: Not on file   Years of education: Not on file   Highest education level: Not on file  Occupational History   Occupation: Therapist  Tobacco Use   Smoking status: Never    Passive exposure: Never   Smokeless tobacco: Never  Vaping Use   Vaping Use: Never used  Substance and Sexual Activity   Alcohol use: Not Currently    Comment: 1 drink/month   Drug use: No   Sexual activity: Not Currently    Partners: Male    Birth control/protection: Surgical    Comment: TAH/LSO  Other Topics Concern   Not on file  Social History Narrative   Not on file   Social Determinants of Health   Financial Resource Strain: Not on file  Food Insecurity: Not on file  Transportation Needs: Not on file  Physical Activity: Not on file  Stress: Not on file  Social Connections: Not on file  Intimate Partner Violence: Not on file    FAMILY HISTORY: Family History  Problem Relation Age of Onset   Breast cancer Mother 17   Diabetes Maternal Uncle    Diabetes Maternal Grandmother    Stroke Maternal Grandmother    Hypertension Maternal Grandmother    Diabetes Maternal Uncle    Lung disease Maternal Grandfather        related to working in saw mill    ALLERGIES:  is allergic to aspirin and codeine.  MEDICATIONS:  Current Outpatient Medications  Medication Sig Dispense Refill   buPROPion (WELLBUTRIN XL) 300 MG 24 hr tablet Take 1 tablet (300 mg total) by mouth daily. 30 tablet 11   estradiol (ESTRACE) 1 MG tablet Take 2 tablets ( 2 mg total) per day. 60 tablet 11   glucosamine-chondroitin 500-400 MG tablet Take 1 tablet by mouth daily.     LORazepam (ATIVAN) 1 MG tablet TAKE ONE-HALF TO ONE TABLET BY MOUTH ONCE DAILY AS NEEDED FOR ANXIETY 30 tablet 1   Multiple Vitamin (MULTIVITAMIN) tablet Take 1 tablet by mouth daily.      nystatin (MYCOSTATIN/NYSTOP) powder Apply 1 application topically 3 (three) times daily. Apply to affected area for up to 7 days 15 g 2   No current facility-administered medications for this visit.    REVIEW OF SYSTEMS:   10 Point review of Systems was done is negative except as noted above.  PHYSICAL EXAMINATION: .BP 127/71   Pulse 90   Temp 97.9 F (36.6 C)   Resp 20   Wt 225 lb 12.8 oz (102.4 kg)   LMP 02/03/2016 (Exact Date)   SpO2 99%   BMI 38.76 kg/m   GENERAL:alert, in no acute distress and comfortable SKIN: no acute rashes, no significant lesions EYES: conjunctiva are pink and non-injected, sclera anicteric  OROPHARYNX: MMM, no exudates, no oropharyngeal erythema or ulceration NECK: supple, no JVD LYMPH:  no palpable lymphadenopathy in the cervical, axillary or inguinal regions LUNGS: clear to auscultation b/l with normal respiratory effort HEART: regular rate & rhythm ABDOMEN:  normoactive bowel sounds , non tender, not distended. Extremity: no pedal edema PSYCH: alert & oriented x 3 with fluent speech NEURO: no focal motor/sensory deficits   LABORATORY DATA:  I have reviewed the data as listed  .    Latest Ref Rng & Units 01/10/2023    1:31 PM 05/12/2022    1:59 PM 10/14/2021    3:39 PM  CBC  WBC 4.0 - 10.5 K/uL 8.4  6.6  7.2   Hemoglobin 12.0 - 15.0 g/dL 16.1  09.6  04.5   Hematocrit 36.0 - 46.0 % 47.7  45.8  48.9   Platelets 150 - 400 K/uL 273  265  257     .    Latest Ref Rng & Units 01/10/2023    1:31 PM 05/12/2022    1:59 PM 10/14/2021    3:39 PM  CMP  Glucose 70 - 99 mg/dL 95  409  94   BUN 6 - 20 mg/dL 12  10  8    Creatinine 0.44 - 1.00 mg/dL 8.11  9.14  7.82   Sodium 135 - 145 mmol/L 141  140  139   Potassium 3.5 - 5.1 mmol/L 4.2  4.2  4.1   Chloride 98 - 111 mmol/L 109  111  106   CO2 22 - 32 mmol/L 26  23  25    Calcium 8.9 - 10.3 mg/dL 9.1  9.1  9.3   Total Protein 6.5 - 8.1 g/dL 7.4  7.2  8.1   Total Bilirubin 0.3 - 1.2 mg/dL 0.5   0.5  0.7   Alkaline Phos 38 - 126 U/L 69  66  69   AST 15 - 41 U/L 26  26  25    ALT 0 - 44 U/L 22  21  22     Component     Latest Ref Rng & Units 10/14/2021  Retic Ct Pct     0.4 - 3.1 % 1.5  RBC.     3.87 - 5.11 MIL/uL 5.13 (H)  Retic Count, Absolute     19.0 - 186.0 K/uL 74.4  Immature Retic Fract     2.3 - 15.9 % 10.8  Erythropoietin     2.6 - 18.5 mIU/mL 7.1  Ferritin     11 - 307 ng/mL 79     RADIOGRAPHIC STUDIES: I have personally reviewed the radiological images as listed and agreed with the findings in the report. No results found.  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  17.4 with a hematocrit of 51.2. Patient has had no previous history of venous thromboembolism or arterial thrombosis. No other focal symptoms related to this currently.  PLAN: -Discussed lab results from today, 01/10/2023, with the patient. CBC shows slightly elevated hemoglobin at 16.5 and slightly elevated hematocrit ar 47.7. CMP is WNL -Labs today show no evidence of polycythemia.  This seems to have resolved with better hydration. -No indication for additional hematologic work-up or hematologic intervention such as therapeutic phlebotomy at this time. -Recommended to drink at least 2 L of water.   FOLLOW-UP: RTC with PCP  The total time spent in the appointment was 15 minutes* .  All of the patient's questions were  answered with apparent satisfaction. The patient knows to call the clinic with any problems, questions or concerns.   Wyvonnia Lora MD MS AAHIVMS [facility] Hematology/Oncology Physician [facility]  .*Total Encounter Time as defined by the Centers for Medicare and Medicaid Services includes, in addition to the face-to-face time of a patient visit (documented in the note above) non-face-to-face time: obtaining and reviewing outside history, ordering and reviewing medications, tests or  procedures, care coordination (communications with other health care professionals or caregivers) and documentation in the medical record.   I, Ok Edwards, am acting as a Neurosurgeon for Wyvonnia Lora, MD. .I have reviewed the above documentation for accuracy and completeness, and I agree with the above. Johney Maine MD

## 2023-01-11 LAB — ERYTHROPOIETIN: Erythropoietin: 6.5 m[IU]/mL (ref 2.6–18.5)

## 2023-05-16 ENCOUNTER — Ambulatory Visit: Payer: Commercial Managed Care - PPO | Admitting: Medical-Surgical

## 2023-05-16 ENCOUNTER — Encounter: Payer: Self-pay | Admitting: Medical-Surgical

## 2023-05-16 VITALS — BP 112/78 | HR 82 | Ht 64.0 in | Wt 238.5 lb

## 2023-05-16 DIAGNOSIS — Z23 Encounter for immunization: Secondary | ICD-10-CM

## 2023-05-16 DIAGNOSIS — F410 Panic disorder [episodic paroxysmal anxiety] without agoraphobia: Secondary | ICD-10-CM | POA: Diagnosis not present

## 2023-05-16 NOTE — Progress Notes (Signed)
        Established patient visit  History, exam, impression, and plan:  1. Panic disorder Pleasant 52 year old female presenting today with a history of panic disorder.  She is currently taking Wellbutrin 300 mg daily which is prescribed by her oncologist.  She is also using lorazepam 1 mg daily as needed for severe anxiety/panic that is not responsive to other measures.  She is aware to use this very sparingly and is only used a little over 30 tablets since February.  Denies SI/HI.  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  Continue Wellbutrin as prescribed.  Continue very sparing use of lorazepam 1 mg daily as needed.  2. Need for shingles vaccine Shingrix No. 1 given in office today.  Second vaccine will be due in 2-6 months. - Zoster Recombinant (Shingrix )   Procedures performed this visit: None.  Return in about 6 months (around 11/16/2023) for mood follow up.  __________________________________ Thayer Ohm, DNP, APRN, FNP-BC Primary Care and Sports Medicine [facility]

## 2023-08-03 ENCOUNTER — Other Ambulatory Visit: Payer: Self-pay | Admitting: Medical-Surgical

## 2023-09-05 ENCOUNTER — Other Ambulatory Visit: Payer: Self-pay | Admitting: Medical-Surgical

## 2023-09-05 DIAGNOSIS — Z1231 Encounter for screening mammogram for malignant neoplasm of breast: Secondary | ICD-10-CM

## 2023-09-19 ENCOUNTER — Ambulatory Visit: Payer: Commercial Managed Care - PPO

## 2023-09-19 DIAGNOSIS — Z1231 Encounter for screening mammogram for malignant neoplasm of breast: Secondary | ICD-10-CM | POA: Diagnosis not present

## 2023-10-04 IMAGING — MG MM DIGITAL SCREENING BILAT W/ TOMO AND CAD
8 series · 8 of 24 positions shown · non-contrast
Comparison: Previous exam(s).

CLINICAL DATA: Screening.

EXAM:
DIGITAL SCREENING BILATERAL MAMMOGRAM WITH TOMOSYNTHESIS AND CAD
TECHNIQUE: Bilateral screening digital craniocaudal and mediolateral oblique
mammograms were obtained. Bilateral screening digital breast
tomosynthesis was performed. The images were evaluated with
computer-aided detection.

[L MLO synth-2D]
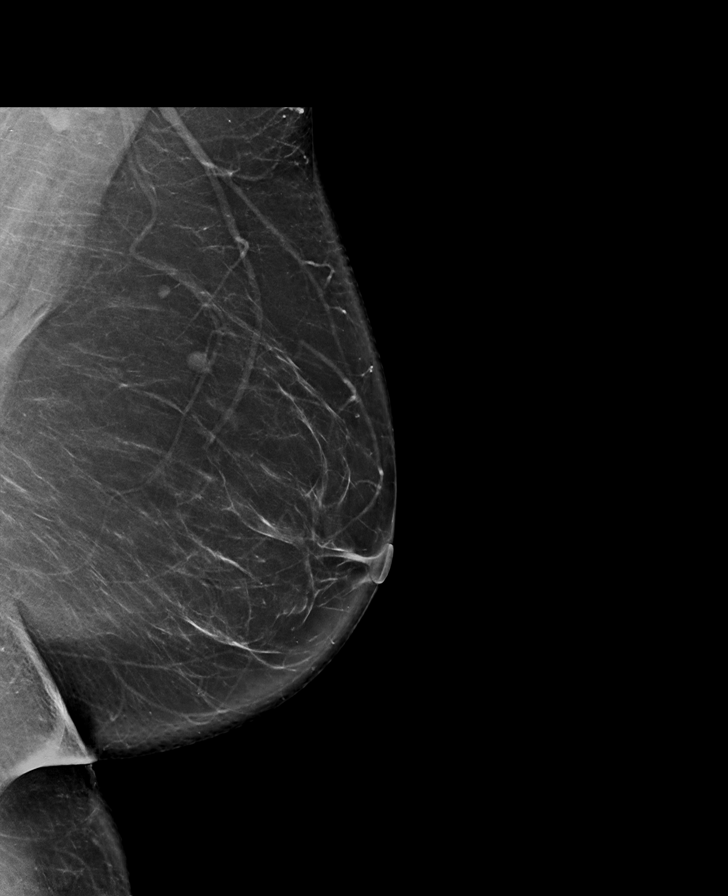

[R MLO synth-2D]
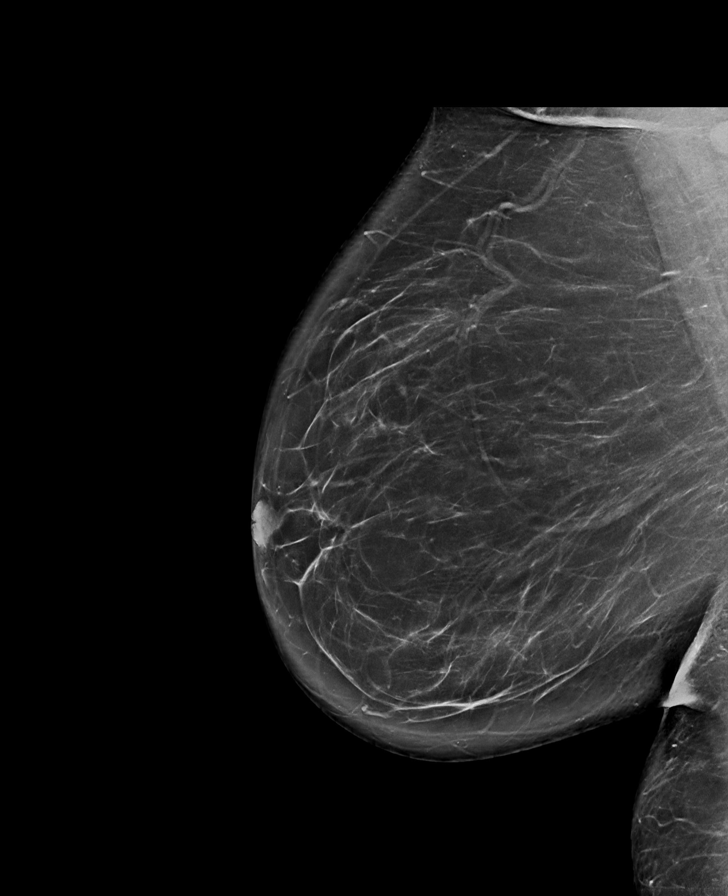

[L CC synth-2D]
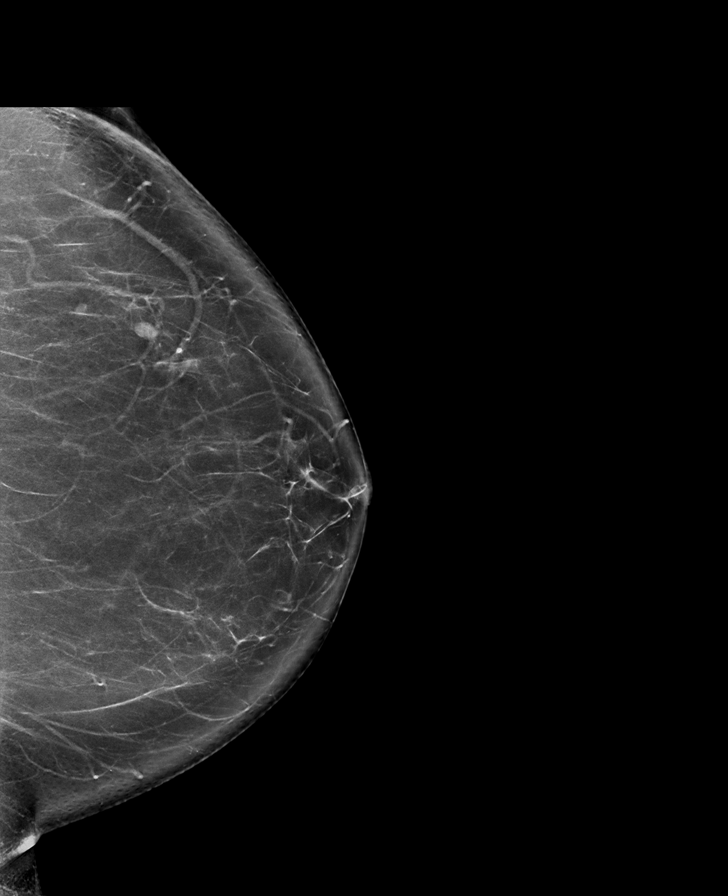

[R CC synth-2D]
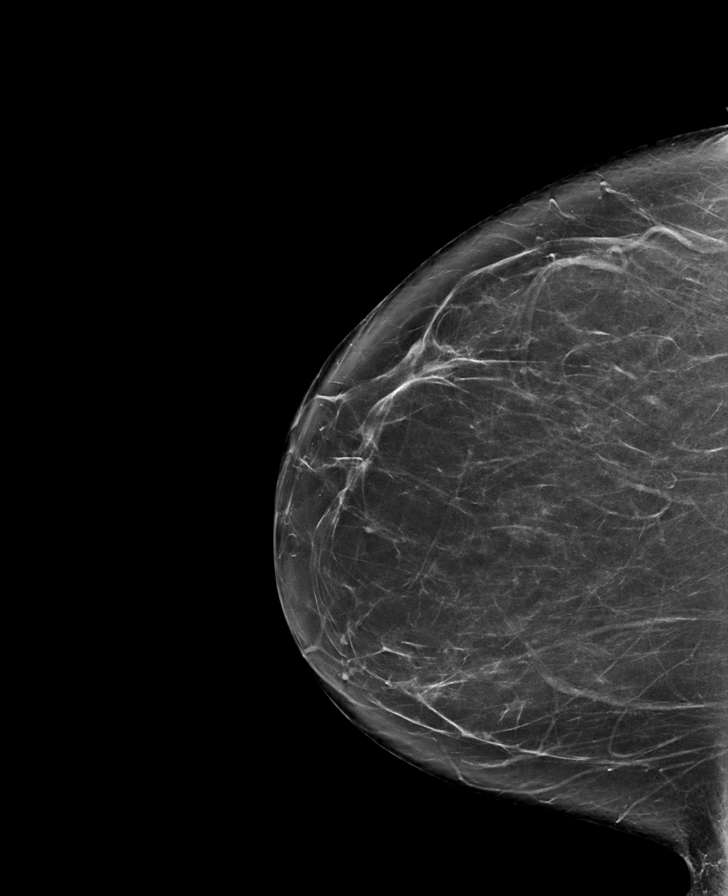

[R CC tomo · tomo slice 41/81.0]
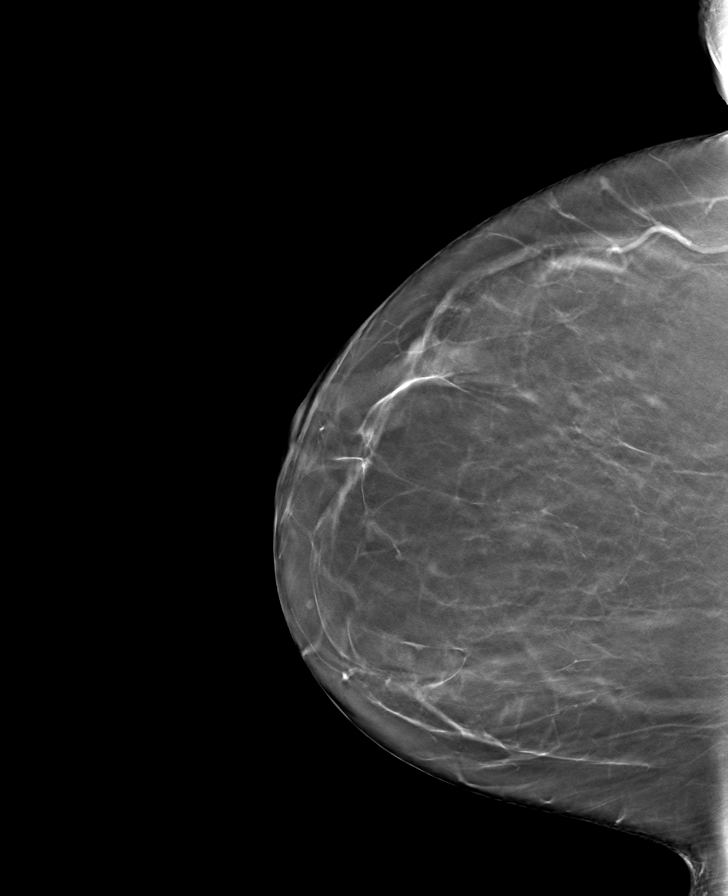

[R MLO tomo · tomo slice 47/92.0]
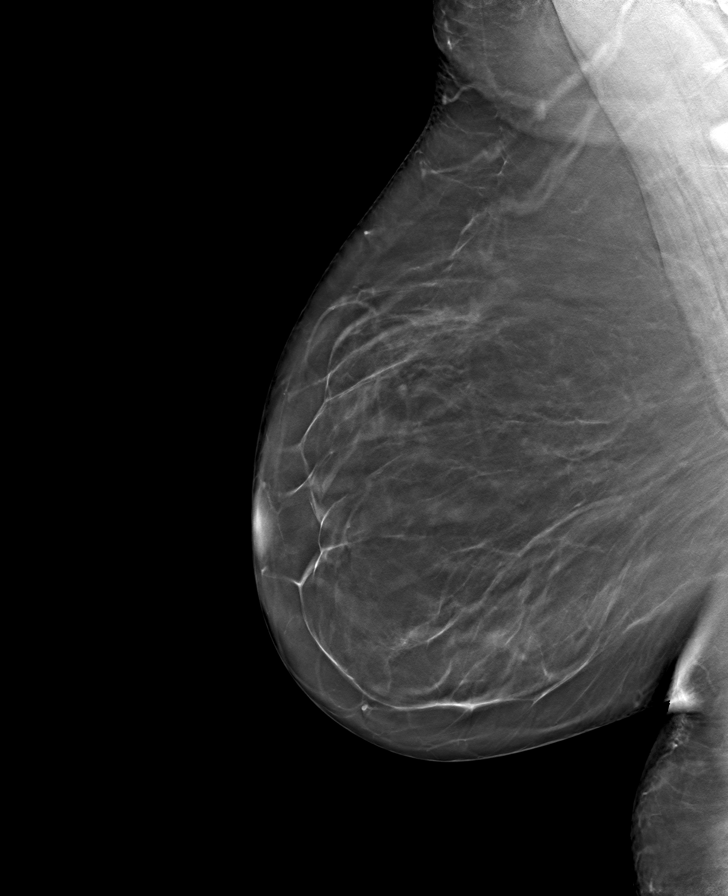

[L CC tomo · tomo slice 45/90.0]
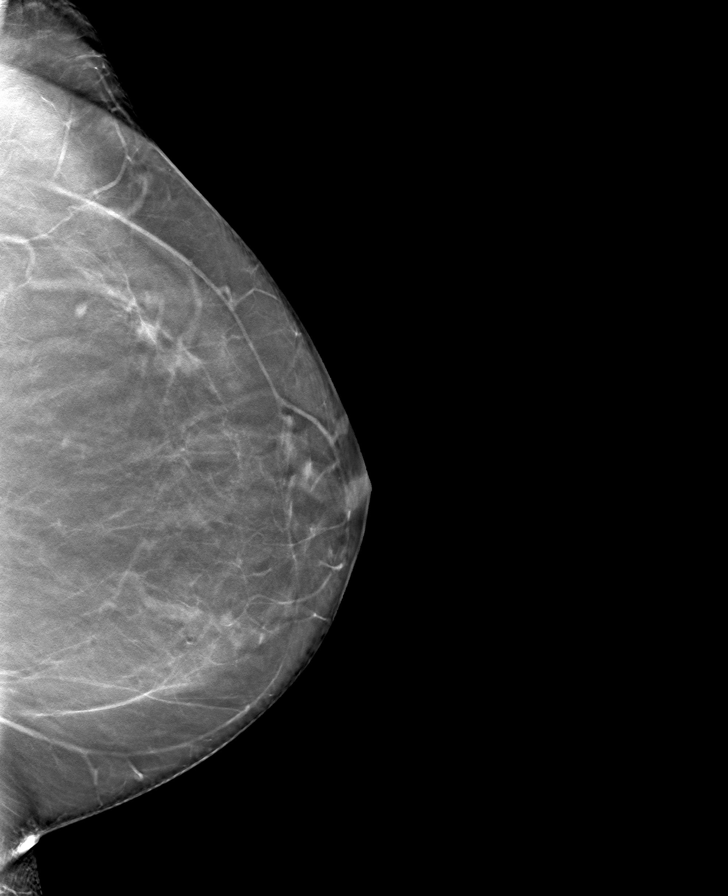

[L MLO tomo · tomo slice 46/91.0]
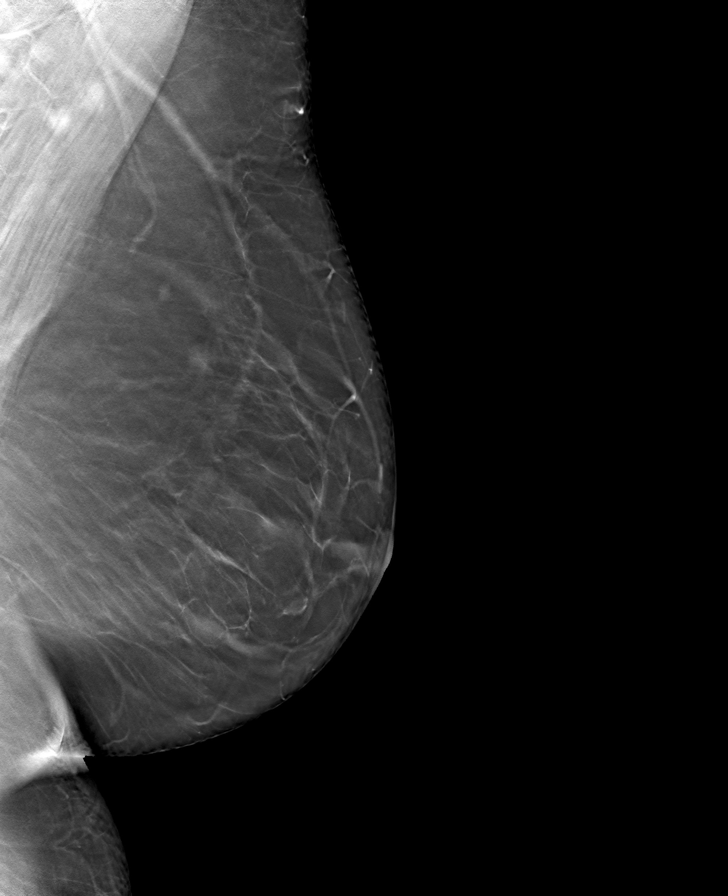

[8 of 24 positions shown; findings below may reference images not displayed]

ACR Breast Density Category b: There are scattered areas of
fibroglandular density.
FINDINGS: There are no findings suspicious for malignancy.
IMPRESSION: No mammographic evidence of malignancy. A result letter of this
screening mammogram will be mailed directly to the patient.

RECOMMENDATION:
Screening mammogram in one year. (Code:51-O-LD2)

BI-RADS CATEGORY  1: Negative.

## 2023-11-21 ENCOUNTER — Ambulatory Visit: Payer: Commercial Managed Care - PPO | Admitting: Medical-Surgical

## 2023-11-21 VITALS — BP 128/85 | HR 67 | Resp 20 | Ht 64.0 in | Wt 238.6 lb

## 2023-11-21 DIAGNOSIS — F410 Panic disorder [episodic paroxysmal anxiety] without agoraphobia: Secondary | ICD-10-CM | POA: Diagnosis not present

## 2023-11-21 DIAGNOSIS — Z23 Encounter for immunization: Secondary | ICD-10-CM | POA: Diagnosis not present

## 2023-11-21 MED ORDER — LORAZEPAM 1 MG PO TABS
ORAL_TABLET | ORAL | 2 refills | Status: DC
Start: 2023-11-21 — End: 2024-05-28

## 2023-11-21 NOTE — Progress Notes (Unsigned)
 Subjective:  Patient ID: Theresa Singleton, female    DOB: [birth year], 53 y.o.   MRN: [id number]  Patient Care Team: Christen Butter, NP as PCP - General (Nurse Practitioner)   Chief Complaint:  Medical Management of Chronic Issues (MOOD FOLLOW UP)   HPI:  Theresa Singleton is a 53 y.o. female presenting on 11/21/2023 for Medical Management of Chronic Issues (MOOD FOLLOW UP)   History, Exam,  Impression and Plan  1. Panic disorder Presents today with a history of anxiety and panic disorder.  She is currently taking Wellbutrin 300 mg daily managed by her GYN and ativan 1mg  daily PRN  for severe anxiety/panic disorder that has not been historically responsive to other measures. Pt takes 0.5 to 1 tab 2-3x weekly with occasional weeks when not needed. 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  Denies SI/HI. Mildly anxious today and states that anxiety maker her BP elevated. Continue Wellbutrin as prescribed.  Continue very sparing use of Ativan 1 mg PRN. -     LORazepam (ATIVAN) 1 MG tablet; TAKE ONE HALF TO ONE TABLET BY MOUTH ONCE DAILY AS NEEDED FOR ANXIETY  2. Need for shingles vaccine Patient tolerated Shingrix #1 and only reports a slightly sore shoulder for a day or two after. Denies any other myalgia or systemic effects. Shingrix #2 given in office today.   - Zoster Recombinant (Shingrix #2)  Continue all other maintenance medications.  Follow up plan: Return in about 6 months (around 05/23/2024), or if symptoms worsen or fail to improve, for mood follow up.   Relevant past medical, surgical, family, and social history reviewed and updated as indicated.  Allergies and medications reviewed and updated. Data reviewed: Chart in Epic.   Past Medical History:  Diagnosis Date   Abnormal Pap smear of cervix 09/20/2014   CIN I   Amenorrhea     Anemia    Hx   Anxiety    Cervical stenosis (uterine cervix)    Depression    Diverticulitis    Endometriosis    Infertility, female    Positive colorectal cancer screening using Cologuard test 10/2022   Needs colonoscopy.   Seasonal allergies    Status post total abdominal hysterectomy 02/17/2016   SVD (spontaneous vaginal delivery)    x 1    Past Surgical History:  Procedure Laterality Date   ABDOMINAL HYSTERECTOMY N/A 02/17/2016   Procedure: HYSTERECTOMY TOTAL ABDOMINAL WITH LSO, EXTENSIVE LYSIS OF ADHESION AND POSSIBLE CYSTOSCOPY;  Surgeon: Patton Salles, MD;  Location: [facility];  Service: Gynecology;  Laterality: N/A;  LEFT SALPINGO OOPHORECTOMY and lysis of adhesions    COLONOSCOPY     CYSTOSCOPY N/A 02/17/2016   Procedure: CYSTOSCOPY;  Surgeon: Patton Salles, MD;  Location: [facility];  Service: Gynecology;  Laterality: N/A;   ENDOMETRIAL ABLATION  2003   HYSTEROSCOPY  2003   HYSTEROSCOPY WITH D & C N/A 12/09/2015   Procedure: DILATATION AND CURETTAGE /HYSTEROSCOPY;  Surgeon: Patton Salles, MD;  Location: [facility];  Service: Gynecology;  Laterality: N/A;   OOPHORECTOMY Right 06/2003    Endometriosis    PELVIC LAPAROSCOPY  2004   several. endometriosis    ROBOTIC ASSISTED SALPINGO OOPHERECTOMY Left 12/09/2015   Procedure: LAPAROSCOPY WITH PELVIC WASHINGS & LYSIS OF ADHESIONS;  Surgeon: Gilman Buttner  Edward Jolly, MD;  Location: [facility];  Service: Gynecology;  Laterality: Left;  Dr. Edward Jolly called at 6:45am to tell team not to drape robot out. The team had already set the room up and draped the robot. The case was posted for a robotic salpingo oophorectomy    Social History   Socioeconomic History   Marital status: Divorced    Spouse name: Not on file   Number of children: Not on file   Years of education: Not on file   Highest education level: Master's degree (e.g., MA, MS, MEng, MEd, MSW, MBA)  Occupational History   Occupation: Therapist  Tobacco  Use   Smoking status: Never    Passive exposure: Never   Smokeless tobacco: Never  Vaping Use   Vaping status: Never Used  Substance and Sexual Activity   Alcohol use: Not Currently    Comment: 1 drink/month   Drug use: No   Sexual activity: Not Currently    Partners: Male    Birth control/protection: Surgical    Comment: TAH/LSO  Other Topics Concern   Not on file  Social History Narrative   Not on file   Social Drivers of Health   Financial Resource Strain: Low Risk  (11/21/2023)   Overall Financial Resource Strain (CARDIA)    Difficulty of Paying Living Expenses: Not very hard  Food Insecurity: No Food Insecurity (11/21/2023)   Hunger Vital Sign    Worried About Running Out of Food in the Last Year: Never true    Ran Out of Food in the Last Year: Never true  Transportation Needs: No Transportation Needs (11/21/2023)   PRAPARE - Administrator, Civil Service (Medical): No    Lack of Transportation (Non-Medical): No  Physical Activity: Insufficiently Active (11/21/2023)   Exercise Vital Sign    Days of Exercise per Week: 3 days    Minutes of Exercise per Session: 20 min  Stress: Stress Concern Present (11/21/2023)   [employer] of Occupational Health - Occupational Stress Questionnaire    Feeling of Stress : To some extent  Social Connections: Moderately Isolated (11/21/2023)   Social Connection and Isolation Panel [NHANES]    Frequency of Communication with Friends and Family: Twice a week    Frequency of Social Gatherings with Friends and Family: Once a week    Attends Religious Services: Never    Database administrator or Organizations: Yes    Attends Engineer, structural: More than 4 times per year    Marital Status: Divorced  Intimate Partner Violence: Unknown (11/19/2022)   Received from [employer], [facility]   HITS    Physically Hurt: Not on file    Insult or Talk Down To: Not on file    Threaten Physical Harm: Not on file    Scream or  Curse: Not on file    Outpatient Encounter Medications as of 11/21/2023  Medication Sig   buPROPion (WELLBUTRIN XL) 300 MG 24 hr tablet Take 1 tablet (300 mg total) by mouth daily.   estradiol (ESTRACE) 1 MG tablet Take 2 tablets ( 2 mg total) per day.   glucosamine-chondroitin 500-400 MG tablet Take 1 tablet by mouth daily.   Multiple Vitamin (MULTIVITAMIN) tablet Take 1 tablet by mouth daily.   nystatin (MYCOSTATIN/NYSTOP) powder Apply 1 application topically 3 (three) times daily. Apply to affected area for up to 7 days   [DISCONTINUED] LORazepam (ATIVAN) 1 MG tablet TAKE ONE HALF TO ONE TABLET BY MOUTH ONCE DAILY  AS NEEDED FOR ANXIETY   LORazepam (ATIVAN) 1 MG tablet TAKE ONE HALF TO ONE TABLET BY MOUTH ONCE DAILY AS NEEDED FOR ANXIETY   No facility-administered encounter medications on file as of 11/21/2023.    Allergies  Allergen Reactions   Codeine Itching   Aspirin Nausea Only and Other (See Comments)    Review of Systems      Objective:  BP 128/85 (BP Location: Left Arm, Cuff Size: Normal)   Pulse 67   Resp 20   Ht 5\' 4"  (1.626 m)   Wt 238 lb 9.6 oz (108.2 kg)   LMP 02/03/2016 (Exact Date)   SpO2 99%   BMI 40.96 kg/m    Wt Readings from Last 3 Encounters:  11/21/23 238 lb 9.6 oz (108.2 kg)  05/16/23 238 lb 8 oz (108.2 kg)  01/10/23 225 lb 12.8 oz (102.4 kg)     Physical Exam  Results for orders placed or performed in visit on 01/10/23  Erythropoietin   Collection Time: 01/10/23  1:31 PM  Result Value Ref Range   Erythropoietin 6.5 2.6 - 18.5 mIU/mL  CMP (Cancer Center only)   Collection Time: 01/10/23  1:31 PM  Result Value Ref Range   Sodium 141 135 - 145 mmol/L   Potassium 4.2 3.5 - 5.1 mmol/L   Chloride 109 98 - 111 mmol/L   CO2 26 22 - 32 mmol/L   Glucose, Bld 95 70 - 99 mg/dL   BUN 12 6 - 20 mg/dL   Creatinine 1.61 0.96 - 1.00 mg/dL   Calcium 9.1 8.9 - 04.5 mg/dL   Total Protein 7.4 6.5 - 8.1 g/dL   Albumin 3.8 3.5 - 5.0 g/dL   AST 26 15 - 41  U/L   ALT 22 0 - 44 U/L   Alkaline Phosphatase 69 38 - 126 U/L   Total Bilirubin 0.5 0.3 - 1.2 mg/dL   GFR, Estimated >40 >98 mL/min   Anion gap 6 5 - 15  CBC with Differential (Cancer Center Only)   Collection Time: 01/10/23  1:31 PM  Result Value Ref Range   WBC Count 8.4 4.0 - 10.5 K/uL   RBC 4.98 3.87 - 5.11 MIL/uL   Hemoglobin 16.5 (H) 12.0 - 15.0 g/dL   HCT 11.9 (H) 14.7 - 82.9 %   MCV 95.8 80.0 - 100.0 fL   MCH 33.1 26.0 - 34.0 pg   MCHC 34.6 30.0 - 36.0 g/dL   RDW 56.2 13.0 - 86.5 %   Platelet Count 273 150 - 400 K/uL   nRBC 0.0 0.0 - 0.2 %   Neutrophils Relative % 69 %   Neutro Abs 5.8 1.7 - 7.7 K/uL   Lymphocytes Relative 23 %   Lymphs Abs 1.9 0.7 - 4.0 K/uL   Monocytes Relative 6 %   Monocytes Absolute 0.5 0.1 - 1.0 K/uL   Eosinophils Relative 1 %   Eosinophils Absolute 0.1 0.0 - 0.5 K/uL   Basophils Relative 1 %   Basophils Absolute 0.1 0.0 - 0.1 K/uL   Immature Granulocytes 0 %   Abs Immature Granulocytes 0.02 0.00 - 0.07 K/uL  Reticulocytes   Collection Time: 01/10/23  1:31 PM  Result Value Ref Range   Retic Ct Pct 1.6 0.4 - 3.1 %   RBC. 4.94 3.87 - 5.11 MIL/uL   Retic Count, Absolute 81.0 19.0 - 186.0 K/uL   Immature Retic Fract 11.9 2.3 - 15.9 %  Ferritin   Collection Time: 01/10/23  1:32 PM  Result Value Ref Range   Ferritin 30 11 - 307 ng/mL       Pertinent labs & imaging results that were available during my care of the patient were reviewed by me and considered in my medical decision making.   Continue healthy lifestyle choices, including diet (rich in fruits, vegetables, and lean proteins, and low in salt and simple carbohydrates) and exercise (at least 30 minutes of moderate physical activity daily).   The above assessment and management plan was discussed with the patient. The patient verbalized understanding of and has agreed to the management plan. Patient is aware to call the clinic if they develop any new symptoms or if symptoms persist or  worsen. Patient is aware when to return to the clinic for a follow-up visit. Patient educated on when it is appropriate to go to the emergency department.   Maryelizabeth Kaufmann Student AGNP

## 2023-11-22 NOTE — Progress Notes (Signed)
 Medical screening examination/treatment was performed by qualified clinical staff member and as supervising provider I was immediately available for consultation/collaboration. I have reviewed documentation and agree with assessment and plan.  Thayer Ohm, DNP, APRN, FNP-BC [facility] and Sports Medicine

## 2023-12-05 NOTE — Progress Notes (Signed)
 53 y.o. G8P1001 Divorced Caucasian female here for annual exam.    Patient is followed for ERT and depression.  Wants to continue Estrace 2 mg daily.  Doing ok on her Wellbutrin.  Has some life stress from starting a new behavioral health office.  Is on her grief journey from losing her son.   Denies suicidal ideation.  States she does not need additional assistance today.  Used Lexapro in the past, and it was not as helpful for treating depression.   Wants routine labs today.  Her best friends are taking her on a cruise.  PCP: Christen Butter, NP   Patient's last menstrual period was 02/03/2016 (exact date).           Sexually active: No.  The current method of family planning is status post hysterectomy.    Menopausal hormone therapy:  estrace  Exercising: Yes.     walking Smoker:  no  OB History  Gravida Para Term Preterm AB Living  1 1 1  0 0 1  SAB IAB Ectopic Multiple Live Births  0 0 0 0 1    # Outcome Date GA Lbr Len/2nd Weight Sex Type Anes PTL Lv  1 Term 06/18/91    M Vag-Spont   LIV     HEALTH MAINTENANCE: Last 2 paps:  02/24/15 neg History of abnormal Pap or positive HPV:  yes, , CIN1 2016. 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 Pathology showed koilocytic atypia and endometriosis and ECC showed acute and chronic inflammation. Pap 10/17/13 - LGSIL and negative HR HPV, Pap 03/19/14 - Neg and meg HR HPV, Pap 07/15/14 - negative and no high risk HPV testing, Pap 01/20/15 - negative but no endocervical cells and no high risk HPV testing, Pap 02/24/15 - negative pap and no HR HPV testing done.  Specimen was adequate.   Hysterectomy 2017 with final pathology - benign cervix.  Mammogram:   09/19/23 Breast Density Cat B, BI-RADS CAT 1 neg Colonoscopy:  cologuard 11/01/22 positive.  Colonoscopy done 02/28/23:  polyps noted.  Due for follow up colonoscopy in June, 2025.  Bone Density:  n/a  Result  n/a   Immunization History  Administered Date(s) Administered    Influenza,inj,Quad PF,6+ Mos 07/01/2018   Influenza-Unspecified 06/20/2021, 06/26/2023   Janssen (J&J) SARS-COV-2 Vaccination 12/02/2019   PFIZER Comirnaty(Gray Top)Covid-19 Tri-Sucrose Vaccine 07/26/2020   PFIZER(Purple Top)SARS-COV-2 Vaccination 02/21/2021   Pfizer Covid-19 Vaccine Bivalent Booster 31yrs & up 07/18/2021   Tdap 11/22/2011, 05/10/2022, 10/04/2022   Zoster Recombinant(Shingrix) 05/16/2023, 11/21/2023      reports that she has never smoked. She has never been exposed to tobacco smoke. She has never used smokeless tobacco. She reports that she does not currently use alcohol. She reports that she does not use drugs.  Past Medical History:  Diagnosis Date   Abnormal Pap smear of cervix 09/20/2014   CIN I   Amenorrhea    Anemia    Hx   Anxiety    Cervical stenosis (uterine cervix)    Depression    Diverticulitis    Endometriosis    Infertility, female    Positive colorectal cancer screening using Cologuard test 10/2022   Needs colonoscopy.   Seasonal allergies    Status post total abdominal hysterectomy 02/17/2016   SVD (spontaneous vaginal delivery)    x 1    Past Surgical History:  Procedure Laterality Date   ABDOMINAL HYSTERECTOMY N/A 02/17/2016   Procedure: HYSTERECTOMY TOTAL ABDOMINAL WITH LSO, EXTENSIVE LYSIS OF ADHESION  AND POSSIBLE CYSTOSCOPY;  Surgeon: Patton Salles, MD;  Location: [facility];  Service: Gynecology;  Laterality: N/A;  LEFT SALPINGO OOPHORECTOMY and lysis of adhesions    COLONOSCOPY     CYSTOSCOPY N/A 02/17/2016   Procedure: CYSTOSCOPY;  Surgeon: Patton Salles, MD;  Location: [facility];  Service: Gynecology;  Laterality: N/A;   ENDOMETRIAL ABLATION  2003   HYSTEROSCOPY  2003   HYSTEROSCOPY WITH D & C N/A 12/09/2015   Procedure: DILATATION AND CURETTAGE /HYSTEROSCOPY;  Surgeon: Patton Salles, MD;  Location: [facility];  Service: Gynecology;  Laterality: N/A;   OOPHORECTOMY Right 06/2003    Endometriosis    PELVIC  LAPAROSCOPY  2004   several. endometriosis    ROBOTIC ASSISTED SALPINGO OOPHERECTOMY Left 12/09/2015   Procedure: LAPAROSCOPY WITH PELVIC WASHINGS & LYSIS OF ADHESIONS;  Surgeon: Patton Salles, MD;  Location: [facility];  Service: Gynecology;  Laterality: Left;  Dr. Edward Jolly called at 6:45am to tell team not to drape robot out. The team had already set the room up and draped the robot. The case was posted for a robotic salpingo oophorectomy    Current Outpatient Medications  Medication Sig Dispense Refill   buPROPion (WELLBUTRIN XL) 300 MG 24 hr tablet Take 1 tablet (300 mg total) by mouth daily. 30 tablet 11   estradiol (ESTRACE) 1 MG tablet Take 2 tablets ( 2 mg total) per day. 60 tablet 11   glucosamine-chondroitin 500-400 MG tablet Take 1 tablet by mouth daily.     LORazepam (ATIVAN) 1 MG tablet TAKE ONE HALF TO ONE TABLET BY MOUTH ONCE DAILY AS NEEDED FOR ANXIETY 30 tablet 2   Multiple Vitamin (MULTIVITAMIN) tablet Take 1 tablet by mouth daily.     nystatin (MYCOSTATIN/NYSTOP) powder Apply 1 application topically 3 (three) times daily. Apply to affected area for up to 7 days 15 g 2   No current facility-administered medications for this visit.    ALLERGIES: Codeine and Aspirin  Family History  Problem Relation Age of Onset   Breast cancer Mother 66   Diabetes Maternal Uncle    Diabetes Maternal Grandmother    Stroke Maternal Grandmother    Hypertension Maternal Grandmother    Diabetes Maternal Uncle    Lung disease Maternal Grandfather        related to working in saw mill    Review of Systems  All other systems reviewed and are negative.   PHYSICAL EXAM:  BP 130/82 (BP Location: Left Arm, Patient Position: Sitting, Cuff Size: Large)   Pulse 90   Ht 5' 5.5" (1.664 m)   Wt 236 lb (107 kg)   LMP 02/03/2016 (Exact Date)   SpO2 97%   BMI 38.68 kg/m     General appearance: alert, cooperative and appears stated age Head: normocephalic, without obvious abnormality,  atraumatic Neck: no adenopathy, supple, symmetrical, trachea midline and thyroid normal to inspection and palpation Lungs: clear to auscultation bilaterally Breasts: normal appearance, no masses or tenderness, No nipple retraction or dimpling, No nipple discharge or bleeding, No axillary adenopathy Heart: regular rate and rhythm Abdomen: soft, non-tender; no masses, no organomegaly Extremities: extremities normal, atraumatic, no cyanosis or edema Skin: skin color, texture, turgor normal. No rashes or lesions Lymph nodes: cervical, supraclavicular, and axillary nodes normal. Neurologic: grossly normal  Pelvic: External genitalia:  no lesions              No abnormal inguinal nodes palpated.  Urethra:  normal appearing urethra with no masses, tenderness or lesions              Bartholins and Skenes: normal                 Vagina: normal appearing vagina with normal color and discharge, no lesions              Cervix: absent              Pap taken: No. Bimanual Exam:  Uterus:  absent              Adnexa: no mass, fullness, tenderness              Rectal exam: Yes.  .  Confirms.              Anus:  normal sphincter tone, no lesions  Chaperone was present for exam:  Warren Lacy, CMA  ASSESSMENT: Well woman visit with gynecologic exam. Status post TAH/left salpingo-oophorectomy.  Status post prior right salpingo-oophorectomy.  Hx endometriosis. ERT. Depression.  On Wellbutrin XL.  FH breast cancer in mother.  Positive Cologuard 2024.  Colonoscopy showed polyps.   PHQ-9: 6  PLAN: Mammogram screening discussed. Self breast awareness reviewed. Pap and HRV collected:  no.  Not indicated.  Guidelines for Calcium, Vitamin D, regular exercise program including cardiovascular and weight bearing exercise. Medication refills:  Estrace 2 mg daily, Wellbutrin XL 300 mg daily.  We discussed risks of ERT:  stroke, DVT, and PE.   Routine labs today.  Follow up:  yearly and prn.

## 2023-12-19 ENCOUNTER — Encounter: Payer: Self-pay | Admitting: Obstetrics and Gynecology

## 2023-12-19 ENCOUNTER — Ambulatory Visit (INDEPENDENT_AMBULATORY_CARE_PROVIDER_SITE_OTHER): Payer: Commercial Managed Care - PPO | Admitting: Obstetrics and Gynecology

## 2023-12-19 VITALS — BP 130/82 | HR 90 | Ht 65.5 in | Wt 236.0 lb

## 2023-12-19 DIAGNOSIS — Z0000 Encounter for general adult medical examination without abnormal findings: Secondary | ICD-10-CM

## 2023-12-19 DIAGNOSIS — Z01419 Encounter for gynecological examination (general) (routine) without abnormal findings: Secondary | ICD-10-CM | POA: Diagnosis not present

## 2023-12-19 DIAGNOSIS — Z1331 Encounter for screening for depression: Secondary | ICD-10-CM | POA: Diagnosis not present

## 2023-12-19 MED ORDER — ESTRADIOL 1 MG PO TABS: ORAL_TABLET | ORAL | 11 refills | Status: AC

## 2023-12-19 MED ORDER — BUPROPION HCL ER (XL) 300 MG PO TB24: 300.0000 mg | ORAL_TABLET | Freq: Every day | ORAL | 11 refills | Status: AC

## 2023-12-19 NOTE — Patient Instructions (Signed)

## 2023-12-20 ENCOUNTER — Encounter: Payer: Self-pay | Admitting: Obstetrics and Gynecology

## 2023-12-20 LAB — COMPREHENSIVE METABOLIC PANEL WITH GFR
AG Ratio: 1.3 (calc) (ref 1.0–2.5)
ALT: 14 U/L (ref 6–29)
AST: 19 U/L (ref 10–35)
Albumin: 3.9 g/dL (ref 3.6–5.1)
Alkaline phosphatase (APISO): 65 U/L (ref 37–153)
BUN: 12 mg/dL (ref 7–25)
CO2: 21 mmol/L (ref 20–32)
Calcium: 8.9 mg/dL (ref 8.6–10.4)
Chloride: 110 mmol/L (ref 98–110)
Creat: 0.77 mg/dL (ref 0.50–1.03)
Globulin: 3.1 g/dL (ref 1.9–3.7)
Glucose, Bld: 87 mg/dL (ref 65–99)
Potassium: 4.2 mmol/L (ref 3.5–5.3)
Sodium: 140 mmol/L (ref 135–146)
Total Bilirubin: 0.5 mg/dL (ref 0.2–1.2)
Total Protein: 7 g/dL (ref 6.1–8.1)
eGFR: 92 mL/min/{1.73_m2} (ref >=60)

## 2023-12-20 LAB — CBC
HCT: 47.2 % — ABNORMAL HIGH (ref 35.0–45.0)
Hemoglobin: 16.4 g/dL — ABNORMAL HIGH (ref 11.7–15.5)
MCH: 32.9 pg (ref 27.0–33.0)
MCHC: 34.7 g/dL (ref 32.0–36.0)
MCV: 94.6 fL (ref 80.0–100.0)
MPV: 9.1 fL (ref 7.5–12.5)
Platelets: 281 10*3/uL (ref 140–400)
RBC: 4.99 10*6/uL (ref 3.80–5.10)
RDW: 12.4 % (ref 11.0–15.0)
WBC: 5.9 10*3/uL (ref 3.8–10.8)

## 2023-12-20 LAB — LIPID PANEL
Cholesterol: 206 mg/dL — ABNORMAL HIGH (ref <200)
HDL: 77 mg/dL (ref >=50)
LDL Cholesterol (Calc): 111 mg/dL — ABNORMAL HIGH
Non-HDL Cholesterol (Calc): 129 mg/dL (ref <130)
Total CHOL/HDL Ratio: 2.7 (calc) (ref <5.0)
Triglycerides: 88 mg/dL (ref <150)

## 2023-12-20 LAB — TSH: TSH: 3.72 m[IU]/L

## 2024-05-28 ENCOUNTER — Encounter: Payer: Self-pay | Admitting: Medical-Surgical

## 2024-05-28 ENCOUNTER — Ambulatory Visit: Admitting: Medical-Surgical

## 2024-05-28 VITALS — BP 129/79 | HR 75 | Resp 20 | Ht 65.5 in | Wt 236.0 lb

## 2024-05-28 DIAGNOSIS — Z23 Encounter for immunization: Secondary | ICD-10-CM | POA: Diagnosis not present

## 2024-05-28 DIAGNOSIS — F410 Panic disorder [episodic paroxysmal anxiety] without agoraphobia: Secondary | ICD-10-CM | POA: Diagnosis not present

## 2024-05-28 MED ORDER — LORAZEPAM 1 MG PO TABS: ORAL_TABLET | ORAL | 2 refills | Status: AC

## 2024-05-28 NOTE — Progress Notes (Signed)
        Established patient visit   History of Present Illness   Discussed the use of AI scribe software for clinical note transcription with the patient, who gave verbal consent to proceed.  History of Present Illness   Theresa Singleton is a 53 year old female with panic disorder who presents for a mood follow-up.  Mood and anxiety symptoms - Panic disorder with ongoing management - Mood stabilization with Wellbutrin  300 mg daily - Situational anxiety managed with Ativan , used a few times last week     Physical Exam   Physical Exam Vitals reviewed.  Constitutional:      General: She is not in acute distress.    Appearance: Normal appearance. She is not ill-appearing.  HENT:     Head: Normocephalic and atraumatic.  Cardiovascular:     Rate and Rhythm: Normal rate and regular rhythm.     Pulses: Normal pulses.     Heart sounds: Normal heart sounds. No murmur heard.    No friction rub. No gallop.  Pulmonary:     Effort: Pulmonary effort is normal. No respiratory distress.     Breath sounds: Normal breath sounds. No wheezing.  Skin:    General: Skin is warm and dry.  Neurological:     Mental Status: She is alert and oriented to person, place, and time.  Psychiatric:        Mood and Affect: Mood normal.        Behavior: Behavior normal.        Thought Content: Thought content normal.        Judgment: Judgment normal.    Assessment & Plan   Assessment and Plan    Panic disorder Managed with Ativan  and Wellbutrin , stable with no new symptoms. Denies SI/HI. - Continue Ativan  sparingly as needed. - Wellbutrin  managed by OB/GYN.  General Health Maintenance Received flu vaccination. Discussed pneumonia vaccine guidelines. - Schedule pneumonia vaccine as a nurse visit if desired.     Follow up   Return in about 6 months (around 11/25/2024) for mood follow up. __________________________________ Zada FREDRIK Palin, DNP, APRN, FNP-BC Primary Care and Sports Medicine [facility]

## 2024-09-25 ENCOUNTER — Other Ambulatory Visit: Payer: Self-pay | Admitting: Medical-Surgical

## 2024-09-25 DIAGNOSIS — Z1231 Encounter for screening mammogram for malignant neoplasm of breast: Secondary | ICD-10-CM

## 2024-10-25 ENCOUNTER — Encounter

## 2024-11-26 ENCOUNTER — Ambulatory Visit: Admitting: Medical-Surgical
